# Patient Record
Sex: Female | Born: 1985 | Race: Black or African American | Hispanic: No | Marital: Single | Smoking: Never smoker
Health system: Southern US, Community
[De-identification: ages and names within clinical notes are randomized; demographics above are authoritative.]

## PROBLEM LIST (undated history)

## (undated) DIAGNOSIS — F32A Depression, unspecified: Secondary | ICD-10-CM

## (undated) DIAGNOSIS — F329 Major depressive disorder, single episode, unspecified: Secondary | ICD-10-CM

## (undated) DIAGNOSIS — A599 Trichomoniasis, unspecified: Secondary | ICD-10-CM

## (undated) DIAGNOSIS — E669 Obesity, unspecified: Secondary | ICD-10-CM

## (undated) DIAGNOSIS — R51 Headache: Secondary | ICD-10-CM

## (undated) HISTORY — DX: Headache: R51

## (undated) HISTORY — DX: Trichomoniasis, unspecified: A59.9

## (undated) HISTORY — DX: Depression, unspecified: F32.A

## (undated) HISTORY — DX: Major depressive disorder, single episode, unspecified: F32.9

## (undated) HISTORY — DX: Obesity, unspecified: E66.9

## (undated) HISTORY — PX: TONSILLECTOMY: SUR1361

## (undated) HISTORY — PX: TONSILLECTOMY: SHX5217

---
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
## 2005-04-15 ENCOUNTER — Emergency Department (HOSPITAL_COMMUNITY): Admission: EM | Admit: 2005-04-15 | Discharge: 2005-04-15 | Payer: Self-pay | Admitting: Emergency Medicine

## 2005-05-28 ENCOUNTER — Ambulatory Visit: Payer: Self-pay | Admitting: Nurse Practitioner

## 2005-06-29 ENCOUNTER — Ambulatory Visit: Payer: Self-pay | Admitting: *Deleted

## 2005-10-27 ENCOUNTER — Ambulatory Visit: Payer: Self-pay | Admitting: Nurse Practitioner

## 2005-11-03 ENCOUNTER — Ambulatory Visit: Payer: Self-pay | Admitting: Nurse Practitioner

## 2006-03-06 ENCOUNTER — Ambulatory Visit: Payer: Self-pay | Admitting: Nurse Practitioner
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
## 2006-05-19 ENCOUNTER — Ambulatory Visit: Payer: Self-pay | Admitting: Nurse Practitioner

## 2006-08-19 ENCOUNTER — Ambulatory Visit (HOSPITAL_COMMUNITY): Admission: RE | Admit: 2006-08-19 | Discharge: 2006-08-19 | Payer: Self-pay | Admitting: Obstetrics & Gynecology

## 2006-12-21 ENCOUNTER — Ambulatory Visit: Payer: Self-pay | Admitting: Physician Assistant

## 2006-12-21 ENCOUNTER — Inpatient Hospital Stay (HOSPITAL_COMMUNITY): Admission: AD | Admit: 2006-12-21 | Discharge: 2006-12-21 | Payer: Self-pay | Admitting: Family Medicine

## 2007-01-16 ENCOUNTER — Ambulatory Visit: Payer: Self-pay | Admitting: *Deleted

## 2007-01-16 ENCOUNTER — Inpatient Hospital Stay (HOSPITAL_COMMUNITY): Admission: AD | Admit: 2007-01-16 | Discharge: 2007-01-19 | Payer: Self-pay | Admitting: Obstetrics & Gynecology

## 2007-01-24 ENCOUNTER — Emergency Department (HOSPITAL_COMMUNITY): Admission: EM | Admit: 2007-01-24 | Discharge: 2007-01-24 | Payer: Self-pay | Admitting: Emergency Medicine

## 2007-07-11 IMAGING — US US OB COMP +14 WK
1 series · 13 of 28 positions shown · non-contrast
Comparison: none

CLINICAL DATA: Anatomy scan; EGA by LMP is 17 weeks 5 days.

[Series 1: us ob comp +14 wk · 0.24mm/px · 13 of 75 slices shown]
[im 3/75]
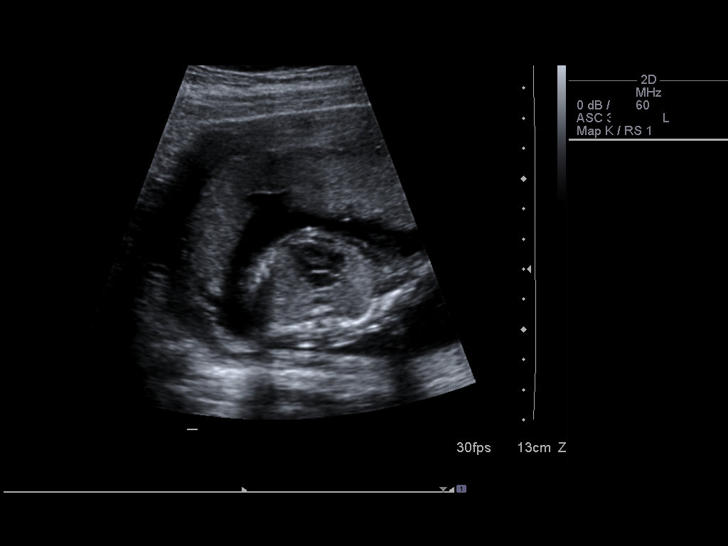
[im 9/75]
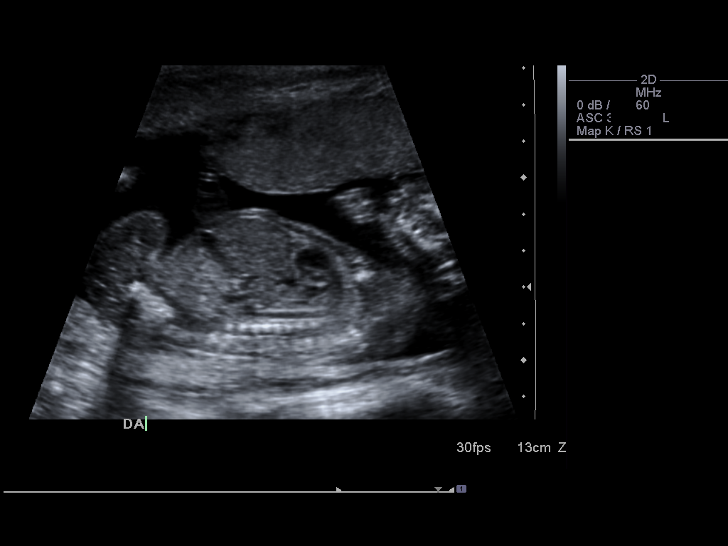
[im 14/75]
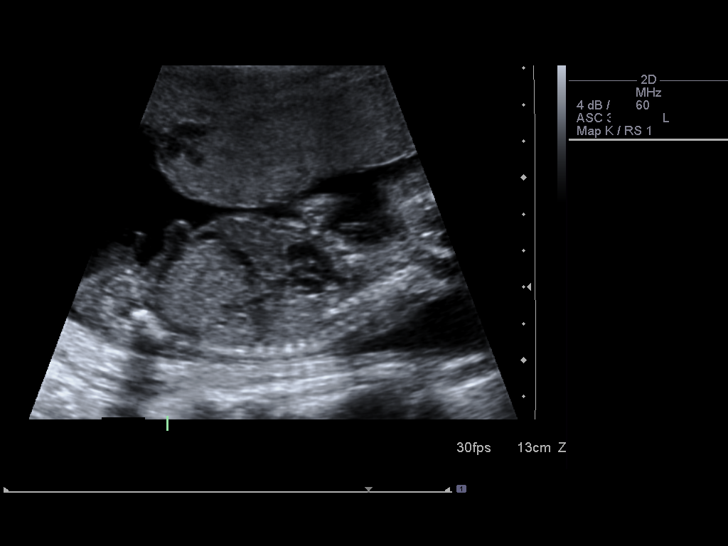
[im 20/75]
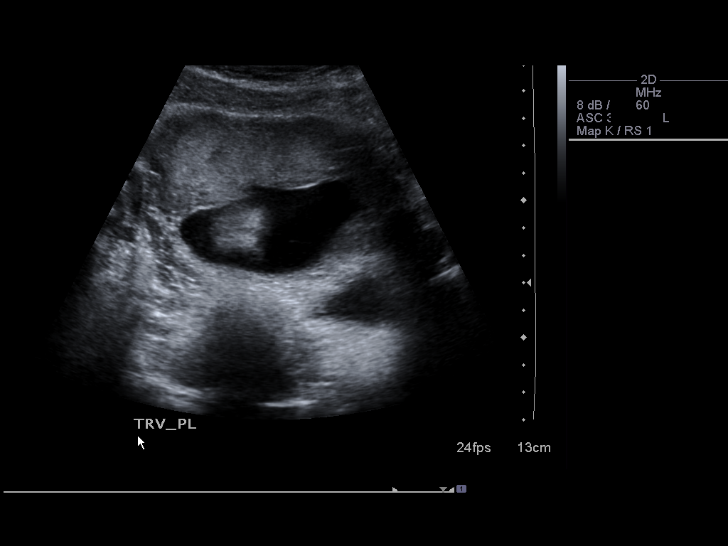
[im 25/75]
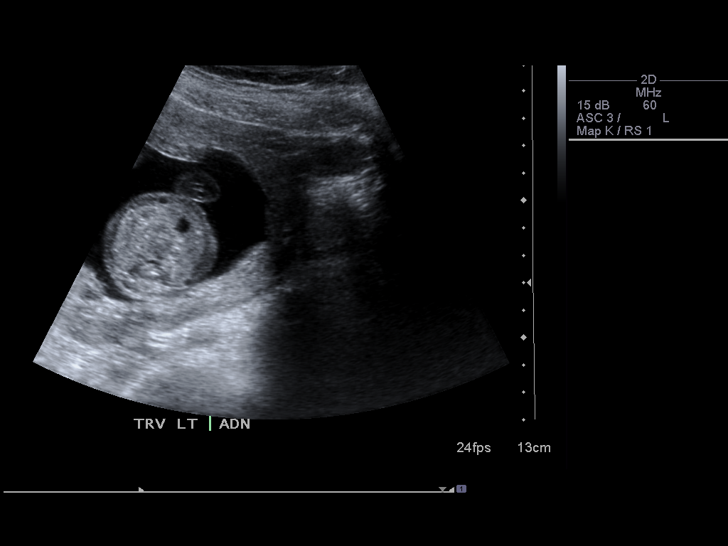
[im 31/75]
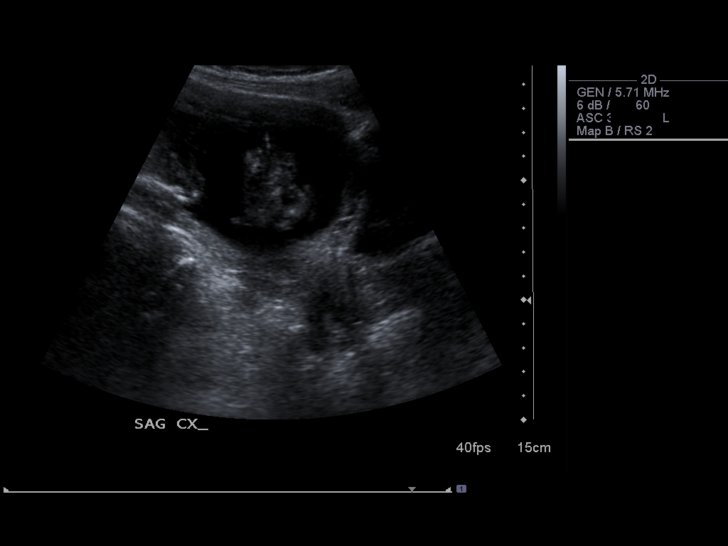
[im 39/75]
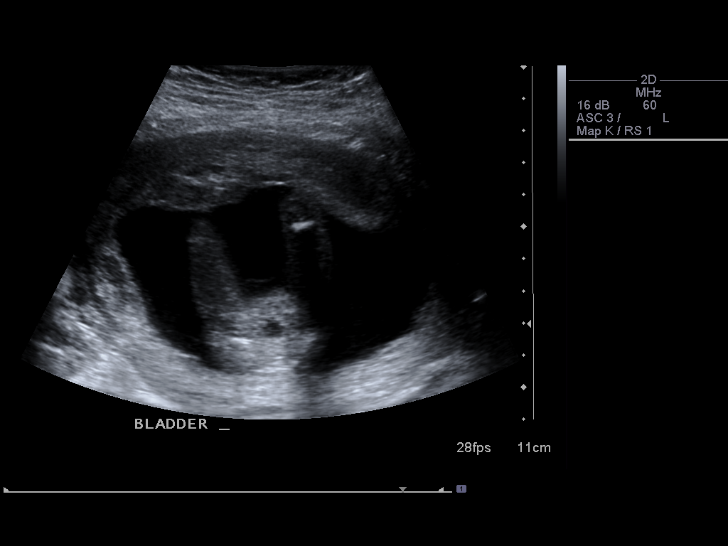
[im 44/75]
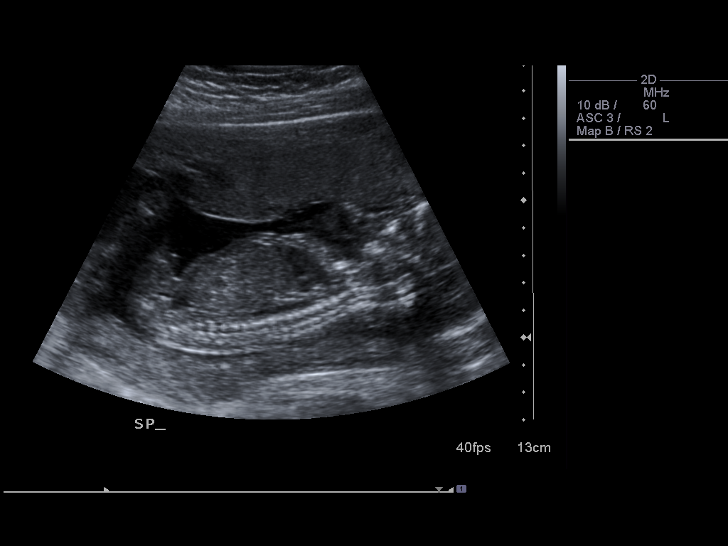
[im 50/75]
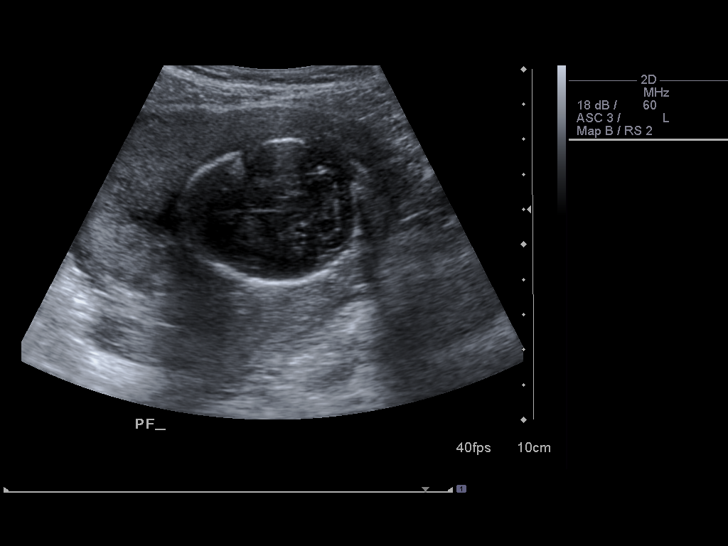
[im 55/75]
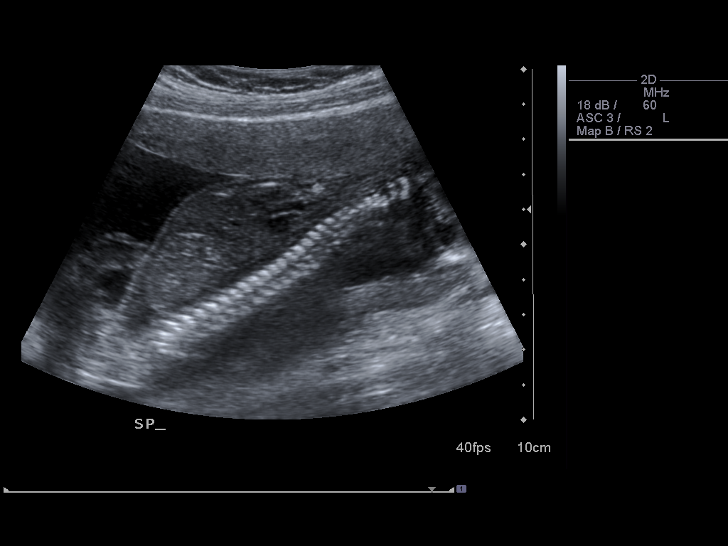
[im 61/75]
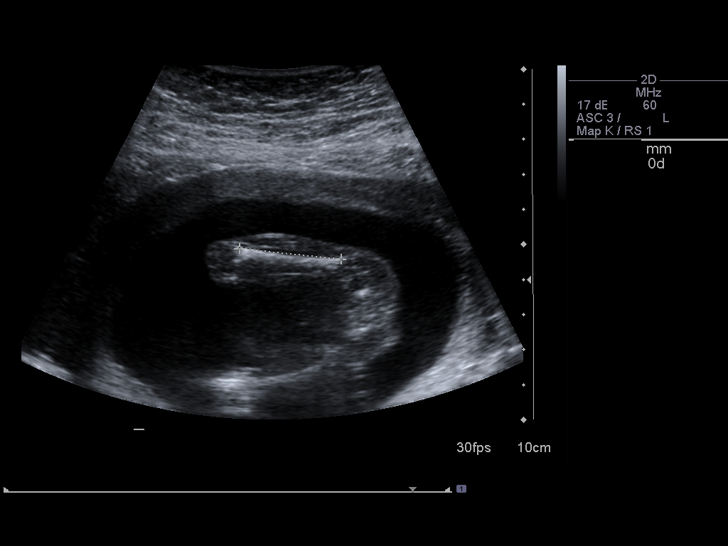
[im 66/75]
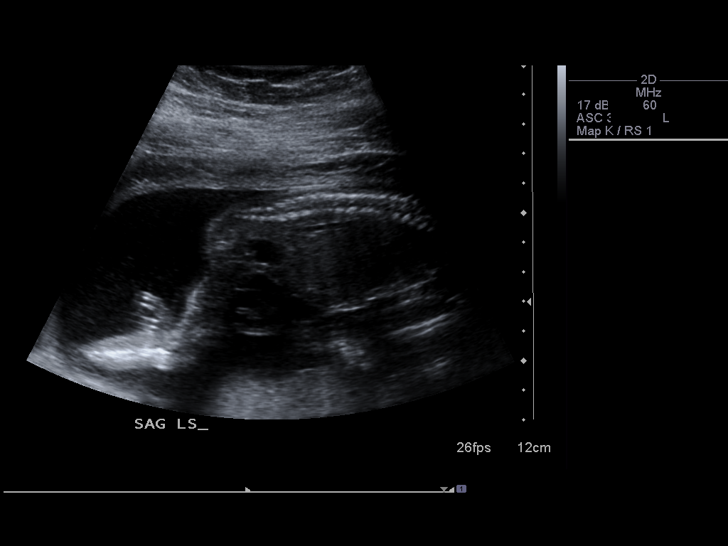
[im 72/75]
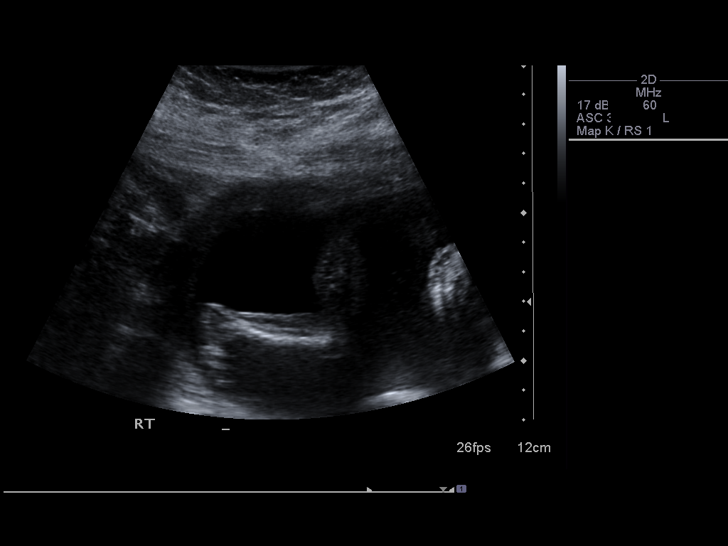

[13 of 28 positions shown; findings below may reference images not displayed]

OBSTETRICAL ULTRASOUND:
 Number of Fetuses:  1
 Heart Rate:  155 bpm
 Movement:  Yes
 Breathing:  No
 Presentation:  Cephalic
 Placental Location:  Right lateral
 Grade:  I
 Previa:  No
 Amniotic Fluid (Subjective):  Normal
 Amniotic Fluid (Objective):  4.6 cm vertical pocket 

 FETAL BIOMETRY
 BPD:  4.2 cm   18 w 5 d 
 HC:  16.1 cm   18 w 6 d 
 AC:  13.7 cm   19 w 1 d 
 FL:  2.9 cm   19 w 0 d 

 MEAN GA:  19 w 0 d   US EDC:  01/13/07

 FETAL ANATOMY
 Lateral Ventricles:  Visualized 
 Thalami/CSP:  Visualized 
 Posterior Fossa:  Visualized 
 Nuchal Region:  NF= 2.4 mm  Visualized 
 Spine:  Visualized 
 4 Chamber Heart on Left:  Visualized 
 Stomach on Left:  Visualized 
 3 Vessel Cord:  Visualized 
 Cord Insertion site:  Visualized 
 Kidneys:  Visualized 
 Bladder:  Visualized 
 Extremities:  Visualized 

 ADDITIONAL ANATOMY VISUALIZED:  LVOT, RVOT, upper lip, orbits, profile, diaphragm, heel, 5th digit, ductal arch, and aortic arch.

 MATERNAL UTERINE AND ADNEXAL FINDINGS
 Cervix:  3.0 cm transabdominal.
IMPRESSION: There is a single living intrauterine gestation with mean gestational age by today?s ultrasound of 19 weeks 0 days, which is over a week older than estimated by LMP.  The visualized anatomy is normal and the fetal indices are concordant.

## 2007-08-28 ENCOUNTER — Emergency Department (HOSPITAL_COMMUNITY): Admission: EM | Admit: 2007-08-28 | Discharge: 2007-08-28 | Payer: Self-pay | Admitting: Emergency Medicine

## 2010-07-05 ENCOUNTER — Inpatient Hospital Stay (HOSPITAL_COMMUNITY)
Admission: AD | Admit: 2010-07-05 | Discharge: 2010-07-06 | Payer: Self-pay | Source: Home / Self Care | Admitting: Family Medicine

## 2010-07-30 ENCOUNTER — Encounter: Payer: Self-pay | Admitting: Internal Medicine

## 2010-07-30 LAB — CONVERTED CEMR LAB: Pap Smear: NORMAL

## 2010-10-11 ENCOUNTER — Other Ambulatory Visit: Payer: Self-pay | Admitting: Internal Medicine

## 2010-10-11 ENCOUNTER — Other Ambulatory Visit: Payer: BC Managed Care – PPO

## 2010-10-11 ENCOUNTER — Encounter: Payer: Self-pay | Admitting: Internal Medicine

## 2010-10-11 ENCOUNTER — Ambulatory Visit (INDEPENDENT_AMBULATORY_CARE_PROVIDER_SITE_OTHER): Payer: BC Managed Care – PPO | Admitting: Internal Medicine

## 2010-10-11 DIAGNOSIS — Z79899 Other long term (current) drug therapy: Secondary | ICD-10-CM

## 2010-10-11 DIAGNOSIS — R51 Headache: Secondary | ICD-10-CM | POA: Insufficient documentation

## 2010-10-11 DIAGNOSIS — IMO0001 Reserved for inherently not codable concepts without codable children: Secondary | ICD-10-CM | POA: Insufficient documentation

## 2010-10-11 DIAGNOSIS — R519 Headache, unspecified: Secondary | ICD-10-CM | POA: Insufficient documentation

## 2010-10-11 DIAGNOSIS — R5383 Other fatigue: Secondary | ICD-10-CM

## 2010-10-11 DIAGNOSIS — F329 Major depressive disorder, single episode, unspecified: Secondary | ICD-10-CM | POA: Insufficient documentation

## 2010-10-11 DIAGNOSIS — R5381 Other malaise: Secondary | ICD-10-CM

## 2010-10-11 LAB — CBC WITH DIFFERENTIAL/PLATELET
Basophils Absolute: 0 10*3/uL (ref 0.0–0.1)
Basophils Relative: 0.2 % (ref 0.0–3.0)
Eosinophils Absolute: 0.1 10*3/uL (ref 0.0–0.7)
Eosinophils Relative: 0.7 % (ref 0.0–5.0)
HCT: 34.8 % — ABNORMAL LOW (ref 36.0–46.0)
Hemoglobin: 12 g/dL (ref 12.0–15.0)
Lymphocytes Relative: 27.2 % (ref 12.0–46.0)
Lymphs Abs: 2.7 10*3/uL (ref 0.7–4.0)
MCHC: 34.4 g/dL (ref 30.0–36.0)
MCV: 89.1 fl (ref 78.0–100.0)
Monocytes Absolute: 0.7 10*3/uL (ref 0.1–1.0)
Monocytes Relative: 6.6 % (ref 3.0–12.0)
Neutro Abs: 6.5 10*3/uL (ref 1.4–7.7)
Neutrophils Relative %: 65.3 % (ref 43.0–77.0)
Platelets: 386 10*3/uL (ref 150.0–400.0)
RBC: 3.91 Mil/uL (ref 3.87–5.11)
RDW: 13 % (ref 11.5–14.6)
WBC: 10 10*3/uL (ref 4.5–10.5)

## 2010-10-11 LAB — BASIC METABOLIC PANEL
BUN: 14 mg/dL (ref 6–23)
CO2: 26 mEq/L (ref 19–32)
Calcium: 9.3 mg/dL (ref 8.4–10.5)
Chloride: 107 mEq/L (ref 96–112)
Creatinine, Ser: 0.9 mg/dL (ref 0.4–1.2)
GFR: 93.43 mL/min (ref >60.00)
Glucose, Bld: 86 mg/dL (ref 70–99)
Potassium: 3.6 mEq/L (ref 3.5–5.1)
Sodium: 139 mEq/L (ref 135–145)

## 2010-10-11 LAB — CK: Total CK: 83 U/L (ref 7–177)

## 2010-10-11 LAB — HEPATIC FUNCTION PANEL
Albumin: 4.2 g/dL (ref 3.5–5.2)
Alkaline Phosphatase: 49 U/L (ref 39–117)
Bilirubin, Direct: 0.1 mg/dL (ref 0.0–0.3)

## 2010-10-11 LAB — CONVERTED CEMR LAB: Vit D, 25-Hydroxy: 23 ng/mL — ABNORMAL LOW (ref 30–89)

## 2010-10-11 LAB — HIGH SENSITIVITY CRP: CRP, High Sensitivity: 2.46 mg/L (ref 0.00–5.00)

## 2010-10-15 NOTE — Assessment & Plan Note (Signed)
Summary: NEW PT / Angela Perkins  #   Vital Signs:  Patient profile:   25 year old female Menstrual status:  regular LMP:     09/22/2010 Height:      63 inches (160.02 cm) Weight:      205.50 pounds (93.41 kg) BMI:     36.53 O2 Sat:      99 % on Room air Temp:     99.0 degrees F (37.22 degrees C) oral Pulse rate:   75 / minute Pulse rhythm:   regular Resp:     14 per minute BP sitting:   120 / 70  (left arm) Cuff size:   regular  Vitals Entered By: Burnard Leigh CMA(AAMA) (October 11, 2010 4:23 PM)  Nutrition Counseling: Patient's BMI is greater than 25 and therefore counseled on weight management options.  O2 Flow:  Room air CC: NP to establish.Pt c/o all-over idiopathic pain in body.has been waiting on PA for Lyrica that just went through.Pt c/o extreme fatigue & weakness.Pt c/o still having migraines w/Rx/sls, cma, Preventive Care Is Patient Diabetic? No Pain Assessment Patient in pain? yes      Comments Pt has not started Lyrica; has been waiting on PA LMP (date): 09/22/2010     Menstrual Status regular Enter LMP: 09/22/2010 Last PAP Result Normal   Primary Care Provider:  Yetta Barre  CC:  NP to establish.Pt c/o all-over idiopathic pain in body.has been waiting on PA for Lyrica that just went through.Pt c/o extreme fatigue & weakness.Pt c/o still having migraines w/Rx/sls, cma, and Preventive Care.  History of Present Illness: New to me she tells me that she has bipolar depression and was sent to PCP by Dr. Evelene Croon for evaluation of "sharp, shooting pain throughout my entire body for one year with some worsening over the last 2 months." Lyrica has been prescribed but she has not started taking it yet.  Preventive Screening-Counseling & Management  Alcohol-Tobacco     Alcohol drinks/day: 0     Alcohol Counseling: not indicated; patient does not drink     Smoking Status: never     Tobacco Counseling: not indicated; no tobacco use  Caffeine-Diet-Exercise     Does Patient  Exercise: no  Hep-HIV-STD-Contraception     Hepatitis Risk: no risk noted     HIV Risk: no risk noted     STD Risk: no risk noted      Sexual History:  currently monogamous.        Drug Use:  no.        Blood Transfusions:  no.    Medications Prior to Update: 1)  None  Current Medications (verified): 1)  Lyrica .... Take As Directed 2)  Lamictal 150 Mg Tabs (Lamotrigine) .... Take 1  Every Morning & 1 At Bedtime 3)  Tegretol 100 Mg Chew (Carbamazepine) .... Take 1  Every Morning and 2 At Bedtime 4)  Kids Gummy Bear Vitamins  Chew (Pediatric Multivit-Minerals-C) .... Take 2 Once Daily  Allergies (verified): No Known Drug Allergies  Past History:  Past Medical History: Depression Headache  Past Surgical History: Tonsillectomy  Family History: Family History of Arthritis Family History Depression Family History Ovarian cancer Sister has fibromyalgia  Social History: Occupation: Hydrologist Single Never Smoked Alcohol use-no Drug use-no Regular exercise-no Smoking Status:  never Hepatitis Risk:  no risk noted HIV Risk:  no risk noted STD Risk:  no risk noted Sexual History:  currently monogamous Blood Transfusions:  no Drug Use:  no  Does Patient Exercise:  no  Review of Systems       The patient complains of weight gain.  The patient denies anorexia, fever, weight loss, decreased hearing, hoarseness, chest pain, syncope, dyspnea on exertion, peripheral edema, prolonged cough, hemoptysis, abdominal pain, hematuria, muscle weakness, suspicious skin lesions, difficulty walking, unusual weight change, abnormal bleeding, enlarged lymph nodes, and angioedema.   General:  Complains of fatigue and malaise; denies chills, fever, loss of appetite, sleep disorder, sweats, weakness, and weight loss. Psych:  Complains of depression; denies alternate hallucination ( auditory/visual), anxiety, easily angered, easily tearful, irritability, mental problems, panic attacks, sense  of great danger, suicidal thoughts/plans, thoughts of violence, unusual visions or sounds, and thoughts /plans of harming others.  Physical Exam  General:  alert, well-developed, well-nourished, well-hydrated, appropriate dress, normal appearance, healthy-appearing, cooperative to examination, good hygiene, and overweight-appearing.   Head:  normocephalic, atraumatic, no abnormalities observed, and no abnormalities palpated.   Eyes:  vision grossly intact, pupils equal, pupils round, and pupils reactive to light.   Ears:  R ear normal and L ear normal.   Mouth:  Oral mucosa and oropharynx without lesions or exudates.  Teeth in good repair. Neck:  supple, full ROM, no masses, no thyromegaly, no thyroid nodules or tenderness, no JVD, no HJR, normal carotid upstroke, no carotid bruits, and no cervical lymphadenopathy.   Lungs:  Normal respiratory effort, chest expands symmetrically. Lungs are clear to auscultation, no crackles or wheezes. Heart:  Normal rate and regular rhythm. S1 and S2 normal without gallop, murmur, click, rub or other extra sounds. Abdomen:  soft, non-tender, normal bowel sounds, no distention, no masses, no guarding, no rigidity, no rebound tenderness, no abdominal hernia, no inguinal hernia, no hepatomegaly, and no splenomegaly.   Msk:  normal ROM, no joint tenderness, no joint swelling, no joint warmth, no redness over joints, no joint deformities, no joint instability, no crepitation, and no muscle atrophy.   Pulses:  R and L carotid,radial,femoral,dorsalis pedis and posterior tibial pulses are full and equal bilaterally Extremities:  No clubbing, cyanosis, edema, or deformity noted with normal full range of motion of all joints.   Neurologic:  No cranial nerve deficits noted. Station and gait are normal. Plantar reflexes are down-going bilaterally. DTRs are symmetrical throughout. Sensory, motor and coordinative functions appear intact. Skin:  Intact without suspicious lesions  or rashes Cervical Nodes:  No lymphadenopathy noted Axillary Nodes:  No palpable lymphadenopathy Inguinal Nodes:  No significant adenopathy Psych:  Cognition and judgment appear intact. Alert and cooperative with normal attention span and concentration. No apparent delusions, illusions, hallucinations   Impression & Recommendations:  Problem # 1:  FIBROMYALGIA (ICD-729.1) Assessment New she will start Lyrica and if that does not help will try Savella Orders: Venipuncture (21308) TLB-BMP (Basic Metabolic Panel-BMET) (80048-METABOL) TLB-CBC Platelet - w/Differential (85025-CBCD) TLB-Hepatic/Liver Function Pnl (80076-HEPATIC) TLB-TSH (Thyroid Stimulating Hormone) (84443-TSH) TLB-CRP-High Sensitivity (C-Reactive Protein) (86140-FCRP) T-Vitamin D (25-Hydroxy) (65784-69629) TLB-CK Total Only(Creatine Kinase/CPK) (82550-CK) T-Rheumatoid Factor (52841-32440) T-Antinuclear Antib (ANA) [phone])  Problem # 2:  LONG-TERM (CURRENT) USE OF OTHER MEDICATIONS (ICD-V58.69) Assessment: New  Orders: Venipuncture (40347) TLB-BMP (Basic Metabolic Panel-BMET) (80048-METABOL) TLB-CBC Platelet - w/Differential (85025-CBCD) TLB-Hepatic/Liver Function Pnl (80076-HEPATIC) TLB-TSH (Thyroid Stimulating Hormone) (84443-TSH) TLB-CRP-High Sensitivity (C-Reactive Protein) (86140-FCRP) T-Vitamin D (25-Hydroxy) (42595-63875) TLB-CK Total Only(Creatine Kinase/CPK) (82550-CK) T-Rheumatoid Factor (64332-95188) T-Antinuclear Antib (ANA) (41660-63016)  Problem # 3:  FATIGUE, ACUTE (ICD-780.79) Assessment: New will screen for secondary causes Orders: Venipuncture (01093) TLB-BMP (Basic Metabolic Panel-BMET) (80048-METABOL) TLB-CBC Platelet - w/Differential (  85025-CBCD) TLB-Hepatic/Liver Function Pnl (80076-HEPATIC) TLB-TSH (Thyroid Stimulating Hormone) (84443-TSH) TLB-CRP-High Sensitivity (C-Reactive Protein) (86140-FCRP) T-Vitamin D (25-Hydroxy) (04540-98119) TLB-CK Total Only(Creatine Kinase/CPK)  (82550-CK) T-Rheumatoid Factor (14782-95621) T-Antinuclear Antib (ANA) (30865-78469)  Problem # 4:  MYALGIA (ICD-729.1) Assessment: New as above  Complete Medication List: 1)  Lyrica  .... Take as directed 2)  Lamictal 150 Mg Tabs (Lamotrigine) .... Take 1  every morning & 1 at bedtime 3)  Tegretol 100 Mg Chew (Carbamazepine) .... Take 1  every morning and 2 at bedtime 4)  Kids Gummy Bear Vitamins Chew (Pediatric multivit-minerals-c) .... Take 2 once daily  PAP Screening:    Hx Cervical Dysplasia in last 5 yrs? No    3 normal PAP smears in last 5 yrs? Yes    Last PAP smear:  07/30/2010  PAP Smear Results:    Date of Exam:  07/30/2010    Results:  Normal  Osteoporosis Risk Assessment:  Risk Factors for Fracture or Low Bone Density:   Smoking status:       never  Patient Instructions: 1)  Please schedule a follow-up appointment in 2 weeks. 2)  It is important that you exercise regularly at least 20 minutes 5 times a week. If you develop chest pain, have severe difficulty breathing, or feel very tired , stop exercising immediately and seek medical attention. 3)  You need to lose weight. Consider a lower calorie diet and regular exercise.    Orders Added: 1)  Venipuncture [36415] 2)  TLB-BMP (Basic Metabolic Panel-BMET) [80048-METABOL] 3)  TLB-CBC Platelet - w/Differential [85025-CBCD] 4)  TLB-Hepatic/Liver Function Pnl [80076-HEPATIC] 5)  TLB-TSH (Thyroid Stimulating Hormone) [84443-TSH] 6)  TLB-CRP-High Sensitivity (C-Reactive Protein) [86140-FCRP] 7)  T-Vitamin D (25-Hydroxy) [62952-84132] 8)  TLB-CK Total Only(Creatine Kinase/CPK) [82550-CK] 9)  T-Rheumatoid Factor [44010-27253] 10)  T-Antinuclear Antib (ANA) [66440-34742] 11)  New Patient Level IV [59563]

## 2010-10-29 LAB — URINALYSIS, ROUTINE W REFLEX MICROSCOPIC
Bilirubin Urine: NEGATIVE
Glucose, UA: NEGATIVE mg/dL
Ketones, ur: NEGATIVE mg/dL
Nitrite: NEGATIVE
pH: 6.5 (ref 5.0–8.0)

## 2010-10-29 LAB — URINE MICROSCOPIC-ADD ON

## 2010-10-29 LAB — WET PREP, GENITAL

## 2010-10-29 LAB — GC/CHLAMYDIA PROBE AMP, GENITAL: Chlamydia, DNA Probe: NEGATIVE

## 2011-04-18 ENCOUNTER — Telehealth: Payer: Self-pay | Admitting: *Deleted

## 2011-04-18 NOTE — Telephone Encounter (Signed)
Pt advised of slightly low vit d and mildly anemic. Not requiring treatment via VM

## 2011-04-18 NOTE — Telephone Encounter (Signed)
Patient requesting results of labs from February.

## 2011-04-18 NOTE — Telephone Encounter (Signed)
Mild anemia and slightly low vitamin d level otherwise normal

## 2011-05-02 ENCOUNTER — Ambulatory Visit: Payer: BC Managed Care – PPO | Admitting: Internal Medicine

## 2011-05-02 DIAGNOSIS — Z0289 Encounter for other administrative examinations: Secondary | ICD-10-CM

## 2012-08-26 ENCOUNTER — Encounter: Payer: Self-pay | Admitting: Obstetrics & Gynecology

## 2012-08-26 ENCOUNTER — Ambulatory Visit (INDEPENDENT_AMBULATORY_CARE_PROVIDER_SITE_OTHER): Payer: BC Managed Care – PPO | Admitting: Obstetrics & Gynecology

## 2012-08-26 VITALS — BP 120/72 | HR 67 | Temp 98.5°F | Resp 16 | Ht 63.0 in | Wt 178.0 lb

## 2012-08-26 DIAGNOSIS — Z124 Encounter for screening for malignant neoplasm of cervix: Secondary | ICD-10-CM

## 2012-08-26 DIAGNOSIS — Z113 Encounter for screening for infections with a predominantly sexual mode of transmission: Secondary | ICD-10-CM

## 2012-08-26 DIAGNOSIS — Z30432 Encounter for removal of intrauterine contraceptive device: Secondary | ICD-10-CM

## 2012-08-26 DIAGNOSIS — Z01419 Encounter for gynecological examination (general) (routine) without abnormal findings: Secondary | ICD-10-CM

## 2012-08-26 DIAGNOSIS — Z0000 Encounter for general adult medical examination without abnormal findings: Secondary | ICD-10-CM

## 2012-08-26 DIAGNOSIS — Z1151 Encounter for screening for human papillomavirus (HPV): Secondary | ICD-10-CM

## 2012-08-26 NOTE — Progress Notes (Signed)
Patient ID: Angela Perkins, female   DOB: [birth year], 27 y.o.   MRN: [id number] Subjective:    Angela Perkins is a 27 y.o. female who presents for an annual exam. The patient has no complaints today. She would like her Mirena removed. It was placed in 2008. She has been abstinent since 2011 and doesn't plan to resume sex in the foreseeable future. She still has heavy periods with the Mirena. The patient is not currently sexually active. GYN screening history: last pap: was normal. The patient wears seatbelts: yes. The patient participates in regular exercise: not asked. Has the patient ever been transfused or tattooed?: no. The patient reports that there is not domestic violence in her life.   Menstrual History: OB History    Grav Para Term Preterm Abortions TAB SAB Ect Mult Living   1 1 1       1       Menarche age: 80 Patient's last menstrual period was 08/24/2012.    The following portions of the patient's history were reviewed and updated as appropriate: allergies, current medications, past family history, past medical history, past social history, past surgical history and problem list.  Review of Systems A comprehensive review of systems was negative. She lives with her 58 yo son and works at [employer] as a Holiday representative. She declines a flu vaccine and will consider Gardasil (written information given).   Objective:    BP 120/72  Pulse 67  Temp 98.5 F (36.9 C) (Oral)  Resp 16  Ht 5\' 3"  (1.6 m)  Wt 178 lb (80.74 kg)  BMI 31.53 kg/m2  LMP 08/24/2012  General Appearance:    Alert, cooperative, no distress, appears stated age  Head:    Normocephalic, without obvious abnormality, atraumatic  Eyes:    PERRL, conjunctiva/corneas clear, EOM's intact, fundi    benign, both eyes  Ears:    Normal TM's and external ear canals, both ears  Nose:   Nares normal, septum midline, mucosa normal, no drainage    or sinus tenderness  Throat:   Lips, mucosa, and tongue normal;  teeth and gums normal  Neck:   Supple, symmetrical, trachea midline, no adenopathy;    thyroid:  no enlargement/tenderness/nodules; no carotid   bruit or JVD  Back:     Symmetric, no curvature, ROM normal, no CVA tenderness  Lungs:     Clear to auscultation bilaterally, respirations unlabored  Chest Wall:    No tenderness or deformity   Heart:    Regular rate and rhythm, S1 and S2 normal, no murmur, rub   or gallop  Breast Exam:    No tenderness, masses, or nipple abnormality  Abdomen:     Soft, non-tender, bowel sounds active all four quadrants,    no masses, no organomegaly  Genitalia:    Normal female without lesion, discharge or tenderness, moderate amount of menstrual bleeding. Mirena easily removed (it was only in as far as the cervix), NSSA, NT, normal adnexa exam     Extremities:   Extremities normal, atraumatic, no cyanosis or edema  Pulses:   2+ and symmetric all extremities  Skin:   Skin color, texture, turgor normal, no rashes or lesions  Lymph nodes:   Cervical, supraclavicular, and axillary nodes normal  Neurologic:   CNII-XII intact, normal strength, sensation and reflexes    throughout  .    Assessment:    Healthy female exam.    Plan:     Chlamydia specimen. GC specimen.  Pap smear.  HPV cotesting SBE/Gardasil recommended

## 2014-06-19 ENCOUNTER — Encounter: Payer: Self-pay | Admitting: Obstetrics & Gynecology

## 2016-01-26 ENCOUNTER — Encounter (HOSPITAL_COMMUNITY): Payer: Self-pay | Admitting: Emergency Medicine

## 2016-01-26 ENCOUNTER — Emergency Department (HOSPITAL_COMMUNITY)
Admission: EM | Admit: 2016-01-26 | Discharge: 2016-01-26 | Disposition: A | Discharge status: Home / Self Care | Payer: Medicaid Other | Attending: Emergency Medicine | Admitting: Emergency Medicine

## 2016-01-26 DIAGNOSIS — F329 Major depressive disorder, single episode, unspecified: Secondary | ICD-10-CM | POA: Insufficient documentation

## 2016-01-26 DIAGNOSIS — K0889 Other specified disorders of teeth and supporting structures: Secondary | ICD-10-CM | POA: Diagnosis present

## 2016-01-26 DIAGNOSIS — K029 Dental caries, unspecified: Secondary | ICD-10-CM | POA: Diagnosis not present

## 2016-01-26 DIAGNOSIS — E669 Obesity, unspecified: Secondary | ICD-10-CM | POA: Insufficient documentation

## 2016-01-26 MED ORDER — AMOXICILLIN 500 MG PO CAPS: 500.0000 mg | ORAL_CAPSULE | Freq: Three times a day (TID) | ORAL | Status: DC

## 2016-01-26 MED ORDER — BUPIVACAINE-EPINEPHRINE (PF) 0.5% -1:200000 IJ SOLN
1.8000 mL | Freq: Once | INTRAMUSCULAR | Status: AC
  Administered 2016-01-26: 1.8 mL
  Filled 2016-01-26: qty 1.8

## 2016-01-26 MED ORDER — HYDROCODONE-ACETAMINOPHEN 5-325 MG PO TABS: ORAL_TABLET | ORAL | Status: DC

## 2016-01-26 MED ORDER — AMOXICILLIN 500 MG PO CAPS
500.0000 mg | ORAL_CAPSULE | Freq: Once | ORAL | Status: AC
  Administered 2016-01-26: 500 mg via ORAL
  Filled 2016-01-26: qty 1

## 2016-01-26 NOTE — Discharge Instructions (Signed)
Take percocet for breakthrough pain, do not drink alcohol, drive, care for children or do other critical tasks while taking percocet.  Return to the emergency room for fever, change in vision, redness to the face that rapidly spreads towards the eye, nausea or vomiting, difficulty swallowing or shortness of breath.   Apply warm compresses to jaw throughout the day.   Take your antibiotics as directed and to the end of the course. DO NOT drink alcohol when taking metronidazole, it will make you very sick!   Followup with a dentist is very important for ongoing evaluation and management of recurrent dental pain. Return to emergency department for emergent changing or worsening symptoms."  Low-cost dental clinic: [facility]**David  Civils  at [phone]**    Dental Care and Dentist Visits Dental care supports good overall health. Regular dental visits can also help you avoid dental pain, bleeding, infection, and other more serious health problems in the future. It is important to keep the mouth healthy because diseases in the teeth, gums, and other oral tissues can spread to other areas of the body. Some problems, such as diabetes, heart disease, and pre-term labor have been associated with poor oral health.  See your dentist every 6 months. If you experience emergency problems such as a toothache or broken tooth, go to the dentist right away. If you see your dentist regularly, you may catch problems early. It is easier to be treated for problems in the early stages.  WHAT TO EXPECT AT A DENTIST VISIT  Your dentist will look for many common oral health problems and recommend proper treatment. At your regular dental visit, you can expect:  Gentle cleaning of the teeth and gums. This includes scraping and polishing. This helps to remove the sticky substance around the teeth and gums (plaque). Plaque forms in the mouth shortly after eating. Over time, plaque hardens on the teeth as tartar. If tartar is not  removed regularly, it can cause problems. Cleaning also helps remove stains.  Periodic X-rays. These pictures of the teeth and supporting bone will help your dentist assess the health of your teeth.  Periodic fluoride treatments. Fluoride is a natural mineral shown to help strengthen teeth. Fluoride treatmentinvolves applying a fluoride gel or varnish to the teeth. It is most commonly done in children.  Examination of the mouth, tongue, jaws, teeth, and gums to look for any oral health problems, such as:  Cavities (dental caries). This is decay on the tooth caused by plaque, sugar, and acid in the mouth. It is best to catch a cavity when it is small.  Inflammation of the gums caused by plaque buildup (gingivitis).  Problems with the mouth or malformed or misaligned teeth.  Oral cancer or other diseases of the soft tissues or jaws. KEEP YOUR TEETH AND GUMS HEALTHY For healthy teeth and gums, follow these general guidelines as well as your dentist's specific advice:  Have your teeth professionally cleaned at the dentist every 6 months.  Brush twice daily with a fluoride toothpaste.  Floss your teeth daily.  Ask your dentist if you need fluoride supplements, treatments, or fluoride toothpaste.  Eat a healthy diet. Reduce foods and drinks with added sugar.  Avoid smoking. TREATMENT FOR ORAL HEALTH PROBLEMS If you have oral health problems, treatment varies depending on the conditions present in your teeth and gums.  Your caregiver will most likely recommend good oral hygiene at each visit.  For cavities, gingivitis, or other oral health disease, your caregiver will perform  a procedure to treat the problem. This is typically done at a separate appointment. Sometimes your caregiver will refer you to another dental specialist for specific tooth problems or for surgery. SEEK IMMEDIATE DENTAL CARE IF:  You have pain, bleeding, or soreness in the gum, tooth, jaw, or mouth area.  A  permanent tooth becomes loose or separated from the gum socket.  You experience a blow or injury to the mouth or jaw area.   This information is not intended to replace advice given to you by your health care provider. Make sure you discuss any questions you have with your health care provider.   Document Released: 04/16/2011 Document Revised: 10/27/2011 Document Reviewed: 04/16/2011 Elsevier Interactive Patient Education [employer]   You may also call [phone]  Dental Assistance  If the dentist on-call cannot see you, please use the resources below:   Patients with Medicaid: [facility] [phone] [address], [phone] [phone]  If unable to pay, or uninsured, contact [facility] [phone]) or [facility] [phone] in [place], [phone] in [facility]) to become qualified for the adult dental clinic  Other Low-Cost Community Dental Services: [facility]- [address] Natasha Bence [place], [place], [address]    [phone], Ext. [phone]    2nd and 4th Thursday of the month at 6:30am    10 clients each day by appointment, can sometimes see walk-in patients if someone does not show for an appointment [facility]- [address] Ether Griffins [place], [place], [address]    [phone] [facility] [address] [place], [place], [address]    [phone]  [facility]- [phone] [facility] Health Department- [phone] [facility] Department- [phone]

## 2016-01-26 NOTE — ED Notes (Signed)
Pt reports left upper toothache x 1 week. Has appointment with dentist coming Wednesday. Been taking ibuprofen , sts helped only for short time.

## 2016-01-26 NOTE — ED Provider Notes (Signed)
CSN: [id number]     Arrival date & time 01/26/16  1152 History  By signing my name below, I, Placido SouLogan Joldersma, attest that this documentation has been prepared under the direction and in the presence of [employer] Ginnifer Creelman, PA-C. Electronically Signed: Placido SouLogan Joldersma, ED Scribe. 01/26/2016. 12:45 PM.   Chief Complaint  Patient presents with  . Dental Pain   The history is provided by the patient. No language interpreter was used.    HPI Comments: Angela Perkins is a 30 y.o. female who presents to the Emergency Department complaining of constant, moderate, left upper dental pain x 1 week. She reports associated, mild, left sided neck pain which she states is a chronic issue and has worsened since the onset of her dental pain. She additionally reports associated, mild, swelling surrounding the affected region. Her pain worsens when chewing. Pt states she has an appointment with her dental provider in 4 days. She has taken OTC ibuprofen, tylenol and hydrocodone from a friend's rx which provides short term pain relief. She denies any known drug allergies. Pt denies fevers or chills.   Past Medical History  Diagnosis Date  . Depression   . Headache(784.0)   . Trichomonas   . Obesity    Past Surgical History  Procedure Laterality Date  . Tonsillectomy     Family History  Problem Relation Age of Onset  . Fibromyalgia Sister   . Arthritis Other   . Cancer Mother     Ovarian  . Depression Other   . Cancer - Ovarian Sister   . Cancer - Cervical Maternal Aunt    Social History  Substance Use Topics  . Smoking status: Never Smoker   . Smokeless tobacco: Never Used     Comment: Regular Exercise - No  . Alcohol Use: No   OB History    Gravida Para Term Preterm AB TAB SAB Ectopic Multiple Living   1 1 1       1      Review of Systems A complete 10 system review of systems was obtained and all systems are negative except as noted in the HPI and PMH.   Allergies  Review of patient's  allergies indicates no known allergies.  Home Medications   Prior to Admission medications   Medication Sig Start Date End Date Taking? Authorizing Provider  lamoTRIgine (LAMICTAL) 150 MG tablet Take 150 mg by mouth every 12 (twelve) hours.      Historical Provider, MD  Pediatric Multivit-Minerals-C (KIDS GUMMY BEAR VITAMINS) CHEW Chew 2 each by mouth daily.      Historical Provider, MD   BP 141/89 mmHg  Pulse 69  Temp(Src) 98.3 F (36.8 C) (Oral)  Resp 18  SpO2 100%  LMP 01/15/2016    Physical Exam  Constitutional: She is oriented to person, place, and time. She appears well-developed and well-nourished. No distress.  HENT:  Head: Normocephalic and atraumatic.  Mouth/Throat: Uvula is midline and oropharynx is clear and moist. No trismus in the jaw. No uvula swelling. No oropharyngeal exudate, posterior oropharyngeal edema, posterior oropharyngeal erythema or tonsillar abscesses.  Generally poor dentition, no gingival swelling, erythema or tenderness to palpation. Patient is handling their secretions. There is no tenderness to palpation or firmness underneath tongue bilaterally. No trismus.    Eyes: Conjunctivae and EOM are normal. Pupils are equal, round, and reactive to light.  Neck: Normal range of motion.  Cardiovascular: Normal rate, regular rhythm and intact distal pulses.   Pulmonary/Chest: Effort normal and breath  sounds normal. No stridor. No respiratory distress. She has no wheezes. She has no rales. She exhibits no tenderness.  Abdominal: Soft. Bowel sounds are normal. She exhibits no distension and no mass. There is no tenderness. There is no rebound and no guarding.  Musculoskeletal: Normal range of motion.  Lymphadenopathy:    She has no cervical adenopathy.  Neurological: She is alert and oriented to person, place, and time.  Skin: Skin is warm and dry.  Psychiatric: She has a normal mood and affect.  Nursing note and vitals reviewed.   ED Course  .Nerve  Block Date/Time: 01/26/2016 4:01 PM Performed by: Wynetta Emery Authorized by: Wynetta Emery Consent: Verbal consent obtained. Risks and benefits: risks, benefits and alternatives were discussed Consent given by: patient Patient identity confirmed: verbally with patient Indications: pain relief Body area: face/mouth Nerve: posterior superior alveolar Laterality: left Patient sedated: no Preparation: Patient was prepped and draped in the usual sterile fashion. Patient position: sitting Needle gauge: 27 G Location technique: anatomical landmarks Local anesthetic: bupivacaine 0.5% with epinephrine Anesthetic total: 1.8 ml Outcome: pain improved Patient tolerance: Patient tolerated the procedure well with no immediate complications    DIAGNOSTIC STUDIES: Oxygen Saturation is 100% on RA, normal by my interpretation.    COORDINATION OF CARE: 12:45 PM Discussed next steps with pt including a dental block and d/c with rx Vicodin and amoxicillin. Pt verbalized understanding and is agreeable with the plan.   Labs Review Labs Reviewed - No data to display  Imaging Review No results found.   EKG Interpretation None      MDM   Final diagnoses:  None    Filed Vitals:   01/26/16 1202 01/26/16 1327  BP: 141/89 122/73  Pulse: 69 68  Temp: 98.3 F (36.8 C) 98.5 F (36.9 C)  TempSrc: Oral Oral  Resp: 18 16  SpO2: 100% 100%    Medications  bupivacaine-epinephrine (MARCAINE W/ EPI) 0.5% -1:200000 injection 1.8 mL (1.8 mLs Infiltration Given 01/26/16 1255)  amoxicillin (AMOXIL) capsule 500 mg (500 mg Oral Given 01/26/16 1255)    Angela Perkins is 30 y.o. female presenting with dental pain associated with dental caries but no signs or symptoms of dental abscess. Patient afebrile, non toxic appearing and swallowing secretions well. I gave patient referral to dentist and stressed the importance of dental follow up for definitive management of dental issues. Patient  voices understanding and is agreeable to plan.  Evaluation does not show pathology that would require ongoing emergent intervention or inpatient treatment. Pt is hemodynamically stable and mentating appropriately. Discussed findings and plan with patient/guardian, who agrees with care plan. All questions answered. Return precautions discussed and outpatient follow up given.   Discharge Medication List as of 01/26/2016  1:23 PM    START taking these medications   Details  amoxicillin (AMOXIL) 500 MG capsule Take 1 capsule (500 mg total) by mouth 3 (three) times daily., Starting 01/26/2016, Until Discontinued, Print    HYDROcodone-acetaminophen (NORCO/VICODIN) 5-325 MG tablet Take 1-2 tablets by mouth every 6 hours as needed for pain and/or cough., Print        I personally performed the services described in this documentation, which was scribed in my presence. The recorded information has been reviewed and is accurate.   Wynetta Emery, PA-C 01/26/16 1602  Samuel Jester, DO 01/27/16 1025

## 2018-09-02 ENCOUNTER — Encounter: Payer: Self-pay | Admitting: Nurse Practitioner

## 2018-09-02 ENCOUNTER — Ambulatory Visit: Payer: Self-pay | Admitting: Nurse Practitioner

## 2018-09-02 VITALS — BP 112/78 | HR 105 | Temp 100.7°F | Resp 12 | Wt 196.8 lb

## 2018-09-02 DIAGNOSIS — R6889 Other general symptoms and signs: Secondary | ICD-10-CM

## 2018-09-02 DIAGNOSIS — J101 Influenza due to other identified influenza virus with other respiratory manifestations: Secondary | ICD-10-CM

## 2018-09-02 LAB — POCT INFLUENZA A/B
Influenza A, POC: NEGATIVE
Influenza B, POC: POSITIVE — AB

## 2018-09-02 MED ORDER — FLUTICASONE PROPIONATE 50 MCG/ACT NA SUSP: 2.0000 | Freq: Every day | NASAL | 0 refills | Status: DC

## 2018-09-02 MED ORDER — OSELTAMIVIR PHOSPHATE 75 MG PO CAPS: 75.0000 mg | ORAL_CAPSULE | Freq: Two times a day (BID) | ORAL | 0 refills | Status: AC

## 2018-09-02 MED ORDER — PSEUDOEPH-BROMPHEN-DM 30-2-10 MG/5ML PO SYRP: 5.0000 mL | ORAL_SOLUTION | Freq: Four times a day (QID) | ORAL | 0 refills | Status: AC | PRN

## 2018-09-02 NOTE — Progress Notes (Signed)
Subjective:     Angela Perkins is a 33 y.o. female who presents for evaluation of influenza like symptoms. Symptoms include fevers up to 100 degrees, chills, headache, myalgias, clear nasal discharge, productive cough, sinus and nasal congestion and sore throat and have been present for 48  hours. She has tried to alleviate the symptoms with Alka Seltzer, Mucinex and Ibuprofen with minimal relief. High risk factors for influenza complications: none.  Patient informed she has not had an influenza vaccine this season.  The following portions of the patient's history were reviewed and updated as appropriate: allergies, current medications and past medical history.  Review of Systems Constitutional: positive for anorexia, chills, fatigue, fevers, malaise and sweats, negative for weight loss Eyes: negative Ears, nose, mouth, throat, and face: positive for nasal congestion, sore throat and sinus pressure, negative for ear drainage, earaches and hoarseness Respiratory: positive for cough, dyspnea on exertion and sputum, negative for asthma, chronic bronchitis, pneumonia, stridor and wheezing Cardiovascular: negative Gastrointestinal: positive for diarrhea, nausea and vomiting, negative for abdominal pain and constipation Neurological: positive for headaches, negative for coordination problems, dizziness, paresthesia, vertigo and weakness     Objective:    BP 112/78   Pulse (!) 105   Temp (!) 100.7 F (38.2 C)   Resp 12   Wt 196 lb 12.8 oz (89.3 kg)   SpO2 99%   BMI 34.86 kg/m   Physical Exam Constitutional:      Appearance: Normal appearance.     Comments: Appears uncomfortable  HENT:     Head: Normocephalic.     Right Ear: Tympanic membrane and ear canal normal.     Left Ear: Tympanic membrane and ear canal normal.     Nose: Congestion present.     Comments: + maxillary and frontal sinus pressure bilaterally    Mouth/Throat:     Pharynx: Posterior oropharyngeal erythema present.  No oropharyngeal exudate.     Comments: + posterior oropharyngeal erythema, tonsils +1 bilaterally, no exudate Eyes:     Pupils: Pupils are equal, round, and reactive to light.  Neck:     Musculoskeletal: Normal range of motion and neck supple. No neck rigidity or muscular tenderness.  Cardiovascular:     Rate and Rhythm: Regular rhythm. Tachycardia present.     Pulses: Normal pulses.     Heart sounds: Normal heart sounds.  Pulmonary:     Effort: Pulmonary effort is normal. No respiratory distress.     Breath sounds: Normal breath sounds. No wheezing or rales.  Abdominal:     General: There is no distension.     Tenderness: There is no abdominal tenderness.  Lymphadenopathy:     Cervical: Cervical adenopathy (superficial cervical adenopathy bilaterally) present.  Skin:    General: Skin is warm.  Neurological:     General: No focal deficit present.     Mental Status: She is alert and oriented to person, place, and time.  Psychiatric:        Mood and Affect: Mood normal.        Thought Content: Thought content normal.      Assessment:   Influenza B  Plan:   Exam findings, diagnosis etiology and medication use and indications reviewed with patient. Follow- Up and discharge instructions provided. No emergent/urgent issues found on exam.  Based on the patient's clinical presentation, symptoms, sudden onset of symptoms, and positive influenza test, patient's symptoms are congruent with influenza B.  Patient's parents did want to do Tamiflu.  We  will send a prescription for Tamiflu and will perform symptomatic treatment with Bromfed and Flonase to help with her nasal congestion.  Instructed patient that she must remain home until fever free for at least 24 hours.  I would also like the patient to return to our office to have an influenza vaccine.  Also discussed complications of influenza such as pneumonia with the patient.  Informed patient that patient most likely will have no issues  once a virus has run its course, but I wanted to make her aware of signs and symptoms if they were to begin.  Patient education was provided. Patient verbalized understanding of information provided and agrees with plan of care (POC), all questions answered. The patient is advised to call or return to clinic if condition does not see an improvement in symptoms, or to seek the care of the closest emergency department if condition worsens with the above plan  1. Flu-like symptoms  - POCT Influenza A/B  2. Influenza B  - oseltamivir (TAMIFLU) 75 MG capsule; Take 1 capsule (75 mg total) by mouth 2 (two) times daily for 5 days.  Dispense: 10 capsule; Refill: 0 - fluticasone (FLONASE) 50 MCG/ACT nasal spray; Place 2 sprays into both nostrils daily for 10 days.  Dispense: 16 g; Refill: 0 - brompheniramine-pseudoephedrine-DM 30-2-10 MG/5ML syrup; Take 5 mLs by mouth 4 (four) times daily as needed for up to 7 days.  Dispense: 150 mL; Refill: 0 -Take medication as prescribed. -Ibuprofen or Tylenol for pain, fever, or general discomfort. -Increase fluids. -Get plenty of rest. -Remain home until fever-free for 24 hours at least. -Sleep elevated on at least 2 pillows at bedtime to help with cough. -Use a humidifier or vaporizer when at home and during sleep to help with cough and nasal congestion. -Purchase OTC Delsym cough medicine.  Use as directed. -May use a teaspoon of honey or over-the-counter cough drops to help with cough. -Return to our office for an influenza vaccine. -Follow-up if symptoms do not improve.

## 2018-09-02 NOTE — Patient Instructions (Addendum)
Influenza, Adult  -Take medication as prescribed. -Ibuprofen or Tylenol for pain, fever, or general discomfort. -Increase fluids. -Get plenty of rest. -Remain home until fever-free for 24 hours at least. -Sleep elevated on at least 2 pillows at bedtime to help with cough. -Use a humidifier or vaporizer when at home and during sleep to help with cough and nasal congestion. -Purchase OTC Delsym cough medicine.  Use as directed. -May use a teaspoon of honey or over-the-counter cough drops to help with cough. -Return to our office for an influenza vaccine. -Follow-up if symptoms do not improve.  Influenza, more commonly known as "the flu," is a viral infection that mainly affects the respiratory tract. The respiratory tract includes organs that help you breathe, such as the lungs, nose, and throat. The flu causes many symptoms similar to the common cold along with high fever and body aches. The flu spreads easily from person to person (is contagious). Getting a flu shot (influenza vaccination) every year is the best way to prevent the flu. What are the causes? This condition is caused by the influenza virus. You can get the virus by:  Breathing in droplets that are in the air from an infected person's cough or sneeze.  Touching something that has been exposed to the virus (has been contaminated) and then touching your mouth, nose, or eyes. What increases the risk? The following factors may make you more likely to get the flu:  Not washing or sanitizing your hands often.  Having close contact with many people during cold and flu season.  Touching your mouth, eyes, or nose without first washing or sanitizing your hands.  Not getting a yearly (annual) flu shot. You may have a higher risk for the flu, including serious problems such as a lung infection (pneumonia), if you:  Are older than 65.  Are pregnant.  Have a weakened disease-fighting system (immune system). You may have a  weakened immune system if you: ? Have HIV or AIDS. ? Are undergoing chemotherapy. ? Are taking medicines that reduce (suppress) the activity of your immune system.  Have a long-term (chronic) illness, such as heart disease, kidney disease, diabetes, or lung disease.  Have a liver disorder.  Are severely overweight (morbidly obese).  Have anemia. This is a condition that affects your red blood cells.  Have asthma. What are the signs or symptoms? Symptoms of this condition usually begin suddenly and last 4-14 days. They may include:  Fever and chills.  Headaches, body aches, or muscle aches.  Sore throat.  Cough.  Runny or stuffy (congested) nose.  Chest discomfort.  Poor appetite.  Weakness or fatigue.  Dizziness.  Nausea or vomiting. How is this diagnosed? This condition may be diagnosed based on:  Your symptoms and medical history.  A physical exam.  Swabbing your nose or throat and testing the fluid for the influenza virus. How is this treated? If the flu is diagnosed early, you can be treated with medicine that can help reduce how severe the illness is and how long it lasts (antiviral medicine). This may be given by mouth (orally) or through an IV. Taking care of yourself at home can help relieve symptoms. Your health care provider may recommend:  Taking over-the-counter medicines.  Drinking plenty of fluids. In many cases, the flu goes away on its own. If you have severe symptoms or complications, you may be treated in a hospital. Follow these instructions at home: Activity  Rest as needed and get plenty of sleep.  Stay home from work or school as told by your health care provider. Unless you are visiting your health care provider, avoid leaving home until your fever has been gone for 24 hours without taking medicine. Eating and drinking  Take an oral rehydration solution (ORS). This is a drink that is sold at pharmacies and retail stores.  Drink  enough fluid to keep your urine pale yellow.  Drink clear fluids in small amounts as you are able. Clear fluids include water, ice chips, diluted fruit juice, and low-calorie sports drinks.  Eat bland, easy-to-digest foods in small amounts as you are able. These foods include bananas, applesauce, rice, lean meats, toast, and crackers.  Avoid drinking fluids that contain a lot of sugar or caffeine, such as energy drinks, regular sports drinks, and soda.  Avoid alcohol.  Avoid spicy or fatty foods. General instructions      Take over-the-counter and prescription medicines only as told by your health care provider.  Use a cool mist humidifier to add humidity to the air in your home. This can make it easier to breathe.  Cover your mouth and nose when you cough or sneeze.  Wash your hands with soap and water often, especially after you cough or sneeze. If soap and water are not available, use alcohol-based hand sanitizer.  Keep all follow-up visits as told by your health care provider. This is important. How is this prevented?   Get an annual flu shot. You may get the flu shot in late summer, fall, or winter. Ask your health care provider when you should get your flu shot.  Avoid contact with people who are sick during cold and flu season. This is generally fall and winter. Contact a health care provider if:  You develop new symptoms.  You have: ? Chest pain. ? Diarrhea. ? A fever.  Your cough gets worse.  You produce more mucus.  You feel nauseous or you vomit. Get help right away if:  You develop shortness of breath or difficulty breathing.  Your skin or nails turn a bluish color.  You have severe pain or stiffness in your neck.  You develop a sudden headache or sudden pain in your face or ear.  You cannot eat or drink without vomiting. Summary  Influenza, more commonly known as "the flu," is a viral infection that primarily affects your respiratory  tract.  Symptoms of the flu usually begin suddenly and last 4-14 days.  Getting an annual flu shot is the best way to prevent getting the flu.  Stay home from work or school as told by your health care provider. Unless you are visiting your health care provider, avoid leaving home until your fever has been gone for 24 hours without taking medicine.  Keep all follow-up visits as told by your health care provider. This is important. This information is not intended to replace advice given to you by your health care provider. Make sure you discuss any questions you have with your health care provider. Document Released: 08/01/2000 Document Revised: 01/20/2018 Document Reviewed: 01/20/2018 Elsevier Interactive Patient Education  2019 [employer].

## 2021-05-30 ENCOUNTER — Ambulatory Visit (HOSPITAL_COMMUNITY)
Admission: EM | Admit: 2021-05-30 | Discharge: 2021-05-30 | Disposition: A | Discharge status: Home / Self Care | Payer: 59 | Attending: Emergency Medicine | Admitting: Emergency Medicine

## 2021-05-30 ENCOUNTER — Encounter (HOSPITAL_COMMUNITY): Payer: Self-pay | Admitting: Emergency Medicine

## 2021-05-30 ENCOUNTER — Other Ambulatory Visit: Payer: Self-pay

## 2021-05-30 DIAGNOSIS — K0889 Other specified disorders of teeth and supporting structures: Secondary | ICD-10-CM | POA: Diagnosis not present

## 2021-05-30 DIAGNOSIS — J0100 Acute maxillary sinusitis, unspecified: Secondary | ICD-10-CM

## 2021-05-30 MED ORDER — LIDOCAINE VISCOUS HCL 2 % MT SOLN: 15.0000 mL | OROMUCOSAL | 0 refills | Status: DC | PRN

## 2021-05-30 MED ORDER — GUAIFENESIN ER 600 MG PO TB12: 600.0000 mg | ORAL_TABLET | Freq: Two times a day (BID) | ORAL | 0 refills | Status: DC

## 2021-05-30 MED ORDER — FLUTICASONE PROPIONATE 50 MCG/ACT NA SUSP: 1.0000 | Freq: Every day | NASAL | 2 refills | Status: DC

## 2021-05-30 NOTE — Discharge Instructions (Addendum)
Your symptoms are either related to your tooth which appears to possibly have a cavity and increase in sinus pressure related to congestion therefore we will attempt to manage both in efforts to get some relief  You may use 800 mg of ibuprofen every 8 hours as needed to help with pain  You may use lidocaine mouth solution every 4 hours in addition  You may begin use of nasal spray every morning to help reduce congestion  You may begin use of Mucinex twice a day as needed  You may follow-up at urgent care at any point for further evaluation if you feel like symptoms are worsening

## 2021-05-30 NOTE — ED Triage Notes (Signed)
Noticed pain and pressure upper mouth.  Removed a partial and had continued pressure.  Took ibuprofen, helped, but did not last for long. touches left side of face near mouth-describing pressure and pain when touching these areas

## 2021-05-31 NOTE — ED Provider Notes (Signed)
[facility]    CSN: [id number] Arrival date & time: 05/30/21  1042      History   Chief Complaint Chief Complaint  Patient presents with   Dental Pain    HPI Angela Perkins is a 35 y.o. female.   Patient presents with possible dental pain, endorses pressure around mouth on the left side.  Painful when touched and with chewing.  Wears partial dental plate, removed, no relief felt.  Endorses that she needs dental work, has no Radiographer, therapeutic.  Patient endorses nasal congestion, rhinorrhea and intermittent generalized headaches.  Has attempted use of over-the-counter medications with no relief.  Denies fever, chills, body aches, ear pain, facial swelling, difficulty swallowing, mouth lesions or sores.  No pertinent medical history.  Past Medical History:  Diagnosis Date   Depression    Headache(784.0)    Obesity    Trichomonas     Patient Active Problem List   Diagnosis Date Noted   DEPRESSION 10/11/2010   Myalgia and myositis, unspecified 10/11/2010   FATIGUE, ACUTE 10/11/2010   HEADACHE 10/11/2010    Past Surgical History:  Procedure Laterality Date   TONSILLECTOMY     TONSILLECTOMY      OB History     Gravida  1   Para  1   Term  1   Preterm      AB      Living  1      SAB      IAB      Ectopic      Multiple      Live Births               Home Medications    Prior to Admission medications   Medication Sig Start Date End Date Taking? Authorizing Provider  fluticasone (FLONASE) 50 MCG/ACT nasal spray Place 1 spray into both nostrils daily. 05/30/21  Yes Armonie Mettler R, NP  guaiFENesin (MUCINEX) 600 MG 12 hr tablet Take 1 tablet (600 mg total) by mouth 2 (two) times daily. 05/30/21  Yes Jalexus Brett R, NP  lidocaine (XYLOCAINE) 2 % solution Use as directed 15 mLs in the mouth or throat every 4 (four) hours as needed for mouth pain. 05/30/21  Yes Tracen Mahler R, NP  amoxicillin (AMOXIL) 500 MG capsule Take 1  capsule (500 mg total) by mouth 3 (three) times daily. Patient not taking: Reported on 05/30/2021 01/26/16   Pisciotta, Joni Reining, PA-C  HYDROcodone-acetaminophen (NORCO/VICODIN) 5-325 MG tablet Take 1-2 tablets by mouth every 6 hours as needed for pain and/or cough. Patient not taking: Reported on 05/30/2021 01/26/16   Pisciotta, Joni Reining, PA-C  lamoTRIgine (LAMICTAL) 150 MG tablet Take 150 mg by mouth every 12 (twelve) hours.   Patient not taking: Reported on 05/30/2021    [provider]  Pediatric Multivit-Minerals-C (KIDS GUMMY BEAR VITAMINS) CHEW Chew 2 each by mouth daily.   Patient not taking: Reported on 05/30/2021    [provider]    Family History Family History  Problem Relation Age of Onset   Cancer Mother        Ovarian   Fibromyalgia Sister    Cancer - Ovarian Sister    Cancer - Cervical Maternal Aunt    Arthritis Other    Depression Other     Social History Social History   Tobacco Use   Smoking status: Never   Smokeless tobacco: Never   Tobacco comments:    Regular Exercise - No  Vaping Use  Vaping Use: Never used  Substance Use Topics   Alcohol use: No   Drug use: No     Allergies   Patient has no known allergies.   Review of Systems Review of Systems Defer to HPI   Physical Exam Triage Vital Signs ED Triage Vitals  Enc Vitals Group     BP 05/30/21 1233 137/86     Pulse Rate 05/30/21 1233 60     Resp 05/30/21 1233 18     Temp 05/30/21 1233 99.3 F (37.4 C)     Temp Source 05/30/21 1233 Oral     SpO2 05/30/21 1233 100 %     Weight --      Height --      Head Circumference --      Peak Flow --      Pain Score 05/30/21 1228 8     Pain Loc --      Pain Edu? --      Excl. in GC? --    No data found.  Updated Vital Signs BP 137/86 (BP Location: Left Arm)   Pulse 60   Temp 99.3 F (37.4 C) (Oral)   Resp 18   LMP 05/16/2021   SpO2 100%   Visual Acuity Right Eye Distance:   Left Eye Distance:   Bilateral  Distance:    Right Eye Near:   Left Eye Near:    Bilateral Near:     Physical Exam Constitutional:      Appearance: Normal appearance. She is normal weight.  HENT:     Head: Normocephalic.     Right Ear: Tympanic membrane, ear canal and external ear normal.     Left Ear: Tympanic membrane, ear canal and external ear normal.     Nose: Congestion present. No rhinorrhea.     Right Sinus: Maxillary sinus tenderness present. No frontal sinus tenderness.     Left Sinus: Maxillary sinus tenderness present. No frontal sinus tenderness.     Mouth/Throat:     Pharynx: Oropharynx is clear.     Comments: Poor dentition, mild to moderate gingival swelling around the left upper incisor, tooth decay present at the left upper incisor, no dental abscess present Neurological:     Mental Status: She is alert.     UC Treatments / Results  Labs (all labs ordered are listed, but only abnormal results are displayed) Labs Reviewed - No data to display  EKG   Radiology No results found.  Procedures Procedures (including critical care time)  Medications Ordered in UC Medications - No data to display  Initial Impression / Assessment and Plan / UC Course  I have reviewed the triage vital signs and the nursing notes.  Pertinent labs & imaging results that were available during my care of the patient were reviewed by me and considered in my medical decision making (see chart for details).  Dental pain Acute nonrecurrent maxillary sinusitis  It is possible that etiology of symptoms are related to tooth decay however patient also has sinus pressure at the location of pain therefore we will move forward conservatively treating for both, patient given written form of local dentist for follow-up, patient to follow-up at urgent care as needed   1.  Over-the-counter ibuprofen recommended for pain management, patient already has medication at home 2.  Lidocaine viscous 2% 15 mL every 4 hours as  needed 3.  Flonase 50 mcg 1 spray each nare daily 4.  Guaifenesin 600 mg twice daily as needed  Final Clinical Impressions(s) / UC Diagnoses   Final diagnoses:  Pain, dental  Acute non-recurrent maxillary sinusitis     Discharge Instructions      Your symptoms are either related to your tooth which appears to possibly have a cavity and increase in sinus pressure related to congestion therefore we will attempt to manage both in efforts to get some relief  You may use 800 mg of ibuprofen every 8 hours as needed to help with pain  You may use lidocaine mouth solution every 4 hours in addition  You may begin use of nasal spray every morning to help reduce congestion  You may begin use of Mucinex twice a day as needed  You may follow-up at urgent care at any point for further evaluation if you feel like symptoms are worsening   ED Prescriptions     Medication Sig Dispense Auth. Provider   lidocaine (XYLOCAINE) 2 % solution Use as directed 15 mLs in the mouth or throat every 4 (four) hours as needed for mouth pain. 100 mL Tayquan Gassman R, NP   fluticasone (FLONASE) 50 MCG/ACT nasal spray Place 1 spray into both nostrils daily. 11.1 mL Jane Birkel R, NP   guaiFENesin (MUCINEX) 600 MG 12 hr tablet Take 1 tablet (600 mg total) by mouth 2 (two) times daily. 30 tablet Valinda Hoar, NP      PDMP not reviewed this encounter.   Valinda Hoar, NP 05/31/21 1032

## 2022-03-18 ENCOUNTER — Ambulatory Visit: Payer: Self-pay | Admitting: Nurse Practitioner

## 2022-03-18 VITALS — BP 120/80 | HR 90

## 2022-03-18 DIAGNOSIS — G8929 Other chronic pain: Secondary | ICD-10-CM

## 2022-03-18 MED ORDER — NAPROXEN 500 MG PO TABS: 500.0000 mg | ORAL_TABLET | Freq: Two times a day (BID) | ORAL | 0 refills | Status: AC
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
## 2022-03-27 ENCOUNTER — Encounter: Payer: Self-pay | Admitting: Nurse Practitioner

## 2022-03-27 ENCOUNTER — Ambulatory Visit: Payer: Self-pay | Admitting: Nurse Practitioner

## 2022-03-27 VITALS — BP 130/80 | HR 80 | Ht 63.0 in | Wt 210.0 lb

## 2022-03-27 DIAGNOSIS — Z789 Other specified health status: Secondary | ICD-10-CM

## 2022-03-27 NOTE — Progress Notes (Addendum)
Office Visit.   Subjective:  Patient ID: Angela Perkins, female    DOB: 02-26-1986  Age: 36 y.o. MRN: [id number]  CC: Wellness exam   HPI [name] presents for wellness exam visit for insurance benefit.  Patient does not have a PCP. PMH significant for: no significant PMH.  Last labs per PCP were completed:no labs completed in several years  Health Maintenance:  PAP smear- overdue for this.  Mammogram: start at age 28.   Smoker:never   Immunizations:  COVID- x3 Does not get yearly flu vaccine.   Lifestyle: Diet- No specific diet at home. Eats what she can afford.  Exercise- No exercise.     Past Medical History:  Diagnosis Date   Depression    Headache(784.0)    Obesity    Trichomonas     Past Surgical History:  Procedure Laterality Date   TONSILLECTOMY     TONSILLECTOMY      Outpatient Medications Prior to Visit  Medication Sig Dispense Refill   fluticasone (FLONASE) 50 MCG/ACT nasal spray Place 1 spray into both nostrils daily. 11.1 mL 2   guaiFENesin (MUCINEX) 600 MG 12 hr tablet Take 1 tablet (600 mg total) by mouth 2 (two) times daily. 30 tablet 0   lamoTRIgine (LAMICTAL) 150 MG tablet Take 150 mg by mouth every 12 (twelve) hours.   (Patient not taking: Reported on 05/30/2021)     lidocaine (XYLOCAINE) 2 % solution Use as directed 15 mLs in the mouth or throat every 4 (four) hours as needed for mouth pain. 100 mL 0   Pediatric Multivit-Minerals-C (KIDS GUMMY BEAR VITAMINS) CHEW Chew 2 each by mouth daily.   (Patient not taking: Reported on 05/30/2021)     No facility-administered medications prior to visit.    ROS Review of Systems  Constitutional:  Positive for appetite change and fatigue. Negative for unexpected weight change. Diaphoresis: not new, has dealt with this over the years.Marland Kitchen Respiratory:  Negative for shortness of breath.   Cardiovascular:  Negative for chest pain and leg swelling.  Musculoskeletal:  Positive for arthralgias  (shoulder pain- improved.).    Objective:  BP 130/80   Pulse 80   Ht 5\' 3"  (1.6 m)   Wt 210 lb (95.3 kg)   SpO2 99%   BMI 37.20 kg/m   Physical Exam Constitutional:      General: She is not in acute distress. HENT:     Head: Normocephalic.  Cardiovascular:     Rate and Rhythm: Normal rate and regular rhythm.     Heart sounds: Normal heart sounds.  Pulmonary:     Effort: Pulmonary effort is normal.  Musculoskeletal:        General: Normal range of motion.  Skin:    General: Skin is warm.  Neurological:     General: No focal deficit present.     Mental Status: She is alert and oriented to person, place, and time.  Psychiatric:        Mood and Affect: Mood normal.        Behavior: Behavior normal.      Assessment & Plan:    Loza was seen today for wellness exam.  Diagnoses and all orders for this visit:  Participant in health and wellness plan -     Comprehensive metabolic panel -     TSH -     Lipid Panel -     CBC with Differential -     Hemoglobin A1c   Adult  wellness physical was conducted today. Importance of diet and exercise were discussed in detail.  We reviewed immunizations and gave recommendations regarding current immunization needed for age.  In addition to this additional areas were also touched on including: will update labs today.  Preventative health exams needed: PAP Smear- encouraged to establish care with PCP.   Patient was advised yearly wellness exam  Orders Placed This Encounter  Procedures   Comprehensive metabolic panel   TSH   Lipid Panel   CBC with Differential   Hemoglobin A1c    No orders of the defined types were placed in this encounter.   Follow-up: Return if symptoms worsen or fail to improve.

## 2022-03-28 LAB — COMPREHENSIVE METABOLIC PANEL
AG Ratio: 1.2 (calc) (ref 1.0–2.5)
ALT: 11 U/L (ref 6–29)
AST: 10 U/L (ref 10–30)
Albumin: 4.1 g/dL (ref 3.6–5.1)
Alkaline phosphatase (APISO): 53 U/L (ref 31–125)
BUN: 8 mg/dL (ref 7–25)
CO2: 25 mmol/L (ref 20–32)
Calcium: 9.4 mg/dL (ref 8.6–10.2)
Chloride: 104 mmol/L (ref 98–110)
Creat: 0.87 mg/dL (ref 0.50–0.97)
Globulin: 3.4 g/dL (calc) (ref 1.9–3.7)
Glucose, Bld: 85 mg/dL (ref 65–99)
Potassium: 4.2 mmol/L (ref 3.5–5.3)
Sodium: 136 mmol/L (ref 135–146)
Total Bilirubin: 0.4 mg/dL (ref 0.2–1.2)
Total Protein: 7.5 g/dL (ref 6.1–8.1)

## 2022-03-28 LAB — LIPID PANEL
Cholesterol: 209 mg/dL — ABNORMAL HIGH (ref <200)
HDL: 66 mg/dL (ref >=50)
LDL Cholesterol (Calc): 120 mg/dL (calc) — ABNORMAL HIGH
Non-HDL Cholesterol (Calc): 143 mg/dL (calc) — ABNORMAL HIGH (ref <130)
Total CHOL/HDL Ratio: 3.2 (calc) (ref <5.0)
Triglycerides: 115 mg/dL (ref <150)

## 2022-03-28 LAB — CBC WITH DIFFERENTIAL/PLATELET
Absolute Monocytes: 352 cells/uL (ref 200–950)
Basophils Absolute: 21 cells/uL (ref 0–200)
Basophils Relative: 0.3 %
Eosinophils Absolute: 48 cells/uL (ref 15–500)
Eosinophils Relative: 0.7 %
HCT: 38.1 % (ref 35.0–45.0)
Hemoglobin: 12.5 g/dL (ref 11.7–15.5)
Lymphs Abs: 1987 cells/uL (ref 850–3900)
MCH: 28.9 pg (ref 27.0–33.0)
MCHC: 32.8 g/dL (ref 32.0–36.0)
MCV: 88 fL (ref 80.0–100.0)
MPV: 9.4 fL (ref 7.5–12.5)
Monocytes Relative: 5.1 %
Neutro Abs: 4492 cells/uL (ref 1500–7800)
Neutrophils Relative %: 65.1 %
Platelets: 387 10*3/uL (ref 140–400)
RBC: 4.33 10*6/uL (ref 3.80–5.10)
RDW: 12.3 % (ref 11.0–15.0)
Total Lymphocyte: 28.8 %
WBC: 6.9 10*3/uL (ref 3.8–10.8)

## 2022-03-28 LAB — HEMOGLOBIN A1C
Hgb A1c MFr Bld: 4.8 % of total Hgb (ref <5.7)
Mean Plasma Glucose: 91 mg/dL
eAG (mmol/L): 5 mmol/L

## 2022-03-28 LAB — TSH: TSH: 0.65 mIU/L

## 2022-05-29 ENCOUNTER — Ambulatory Visit: Payer: PRIVATE HEALTH INSURANCE | Admitting: Family Medicine

## 2022-05-29 ENCOUNTER — Encounter: Payer: Self-pay | Admitting: Family Medicine

## 2022-05-29 VITALS — BP 132/76 | HR 75 | Temp 97.6°F | Ht 63.0 in | Wt 217.0 lb

## 2022-05-29 DIAGNOSIS — G8929 Other chronic pain: Secondary | ICD-10-CM

## 2022-05-29 DIAGNOSIS — M25572 Pain in left ankle and joints of left foot: Secondary | ICD-10-CM

## 2022-05-29 DIAGNOSIS — E669 Obesity, unspecified: Secondary | ICD-10-CM | POA: Diagnosis not present

## 2022-05-29 DIAGNOSIS — M79672 Pain in left foot: Secondary | ICD-10-CM | POA: Diagnosis not present

## 2022-05-29 DIAGNOSIS — L659 Nonscarring hair loss, unspecified: Secondary | ICD-10-CM | POA: Diagnosis not present

## 2022-05-29 NOTE — Progress Notes (Unsigned)
New Patient Office Visit  Subjective    Patient ID: Angela Perkins, female    DOB: May 10, 1986  Age: 36 y.o. MRN: [id number]  CC:  Chief Complaint  Patient presents with   Establish Care    Would like to discuss weight loss options.   Discuss hair loss that has started within the last couple years.   Left ankle has been giving her issues, in the morning it throbs but thinks it is due to her job running around.     HPI PARA ASTI presents to establish care  Weight gain over the past 2 years.  Busy lifestyle.  States she is not eating a healthy diet.   C/o intermittent several months of left foot and ankle pain. Feels like a sprain  Hair thinning. Large area of baldness on the top of her scalp x 1 year.   Denies fever, chills, dizziness, chest pain, palpitations, shortness of breath, abdominal pain, N/V/D.   LMP: 3 week ago.  Not sexually active.   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    Outpatient Encounter Medications as of 05/29/2022  Medication Sig   Triamcinolone Acetonide (NASACORT ALLERGY 24HR NA) Place into the nose.   [DISCONTINUED] fluticasone (FLONASE) 50 MCG/ACT nasal spray Place 1 spray into both nostrils daily.   No facility-administered encounter medications on file as of 05/29/2022.    Past Medical History:  Diagnosis Date   Depression    Headache(784.0)    Obesity    Trichomonas     Past Surgical History:  Procedure Laterality Date   TONSILLECTOMY     TONSILLECTOMY      Family History  Problem Relation Age of Onset   Cancer Mother        Ovarian   Fibromyalgia Sister    Cancer - Ovarian Sister    Cancer - Cervical Maternal Aunt    Arthritis Other    Depression Other     Social History   Socioeconomic History   Marital status: Single    Spouse name: Not on file   Number of children: Not on file   Years of education: Not on file   Highest education level: Not on file  Occupational History   Not on file   Tobacco Use   Smoking status: Never   Smokeless tobacco: Never   Tobacco comments:    Regular Exercise - No  Vaping Use   Vaping Use: Never used  Substance and Sexual Activity   Alcohol use: No   Drug use: No   Sexual activity: Never  Other Topics Concern   Not on file  Social History Narrative   Not on file   Social Determinants of Health   Financial Resource Strain: Not on file  Food Insecurity: Not on file  Transportation Needs: Not on file  Physical Activity: Not on file  Stress: Not on file  Social Connections: Not on file  Intimate Partner Violence: Not on file    ROS      Objective    BP 132/76 (BP Location: Left Arm, Patient Position: Sitting, Cuff Size: Large)   Pulse 75   Temp 97.6 F (36.4 C) (Temporal)   Ht 5\' 3"  (1.6 m)   Wt 217 lb (98.4 kg)   SpO2 99%   BMI 38.44 kg/m   Physical Exam Constitutional:      General: She is not in acute distress.    Appearance: She is not ill-appearing.  HENT:  Head: Hair is abnormal.      Comments: Area of baldness, some new hair growth present. No erythema Cardiovascular:     Rate and Rhythm: Normal rate.  Pulmonary:     Effort: Pulmonary effort is normal.     Breath sounds: Normal breath sounds.  Skin:    General: Skin is warm and dry.  Neurological:     General: No focal deficit present.     Mental Status: She is alert and oriented to person, place, and time.  Psychiatric:        Mood and Affect: Mood normal.        Behavior: Behavior normal.     {Labs (Optional):23779}    Assessment & Plan:   Problem List Items Addressed This Visit   None Visit Diagnoses     Left foot pain    -  Primary   Relevant Orders   Ambulatory referral to Podiatry   Chronic pain of left ankle       Relevant Orders   Ambulatory referral to Podiatry   Hair thinning       Obesity (BMI 30-39.9)           Return in about 2 weeks (around 06/12/2022).   Harland Dingwall, NP-C

## 2022-06-12 ENCOUNTER — Ambulatory Visit: Payer: PRIVATE HEALTH INSURANCE | Admitting: Podiatry

## 2022-06-12 ENCOUNTER — Ambulatory Visit: Payer: PRIVATE HEALTH INSURANCE | Admitting: Family Medicine

## 2022-06-12 ENCOUNTER — Encounter: Payer: Self-pay | Admitting: Family Medicine

## 2022-06-12 VITALS — BP 116/76 | HR 75 | Temp 97.6°F | Ht 63.0 in | Wt 217.0 lb

## 2022-06-12 DIAGNOSIS — M25572 Pain in left ankle and joints of left foot: Secondary | ICD-10-CM

## 2022-06-12 DIAGNOSIS — L659 Nonscarring hair loss, unspecified: Secondary | ICD-10-CM

## 2022-06-12 DIAGNOSIS — E669 Obesity, unspecified: Secondary | ICD-10-CM

## 2022-06-12 DIAGNOSIS — G8929 Other chronic pain: Secondary | ICD-10-CM

## 2022-06-12 DIAGNOSIS — F439 Reaction to severe stress, unspecified: Secondary | ICD-10-CM

## 2022-06-12 NOTE — Patient Instructions (Signed)
Dermatology offices  [facility]: [phone] Address: [address], [place], [place] [address] [place], [place] [address]  [facility]: Phone #: [phone] Address: [address], [place], [place] [address]  [facility]: Phone: [phone]  Address: [address], [place], [place] [address]  [facility] Address: [address], [place], [place] [address] Phone: [phone] 

## 2022-06-12 NOTE — Progress Notes (Signed)
Subjective:     Patient ID: Angela Perkins, female    DOB: Oct 31, 1985, 36 y.o.   MRN: [id number]  Chief Complaint  Patient presents with   Follow-up    2 week f/u    HPI Patient is in today for follow up visit.   Depression and work stress.   Would like to do something about balding/thinning area on her scalp. It has been there for over a year.    Health Maintenance Due  Topic Date Due   HIV Screening  Never done   Hepatitis C Screening  Never done   TETANUS/TDAP  Never done   PAP SMEAR-Modifier  08/27/2015   INFLUENZA VACCINE  Never done    Past Medical History:  Diagnosis Date   Depression    Headache(784.0)    Obesity    Trichomonas     Past Surgical History:  Procedure Laterality Date   TONSILLECTOMY     TONSILLECTOMY      Family History  Problem Relation Age of Onset   Cancer Mother        Ovarian   Fibromyalgia Sister    Cancer - Ovarian Sister    Cancer - Cervical Maternal Aunt    Arthritis Other    Depression Other     Social History   Socioeconomic History   Marital status: Single    Spouse name: Not on file   Number of children: Not on file   Years of education: Not on file   Highest education level: Not on file  Occupational History   Not on file  Tobacco Use   Smoking status: Never   Smokeless tobacco: Never   Tobacco comments:    Regular Exercise - No  Vaping Use   Vaping Use: Never used  Substance and Sexual Activity   Alcohol use: No   Drug use: No   Sexual activity: Never  Other Topics Concern   Not on file  Social History Narrative   Not on file   Social Determinants of Health   Financial Resource Strain: Not on file  Food Insecurity: Not on file  Transportation Needs: Not on file  Physical Activity: Not on file  Stress: Not on file  Social Connections: Not on file  Intimate Partner Violence: Not on file    Outpatient Medications Prior to Visit  Medication Sig Dispense Refill   Triamcinolone Acetonide  (NASACORT ALLERGY 24HR NA) Place into the nose.     No facility-administered medications prior to visit.    No Known Allergies  ROS     Objective:    Physical Exam  BP 116/76 (BP Location: Left Arm, Patient Position: Sitting, Cuff Size: Large)   Pulse 75   Temp 97.6 F (36.4 C) (Temporal)   Ht 5\' 3"  (1.6 m)   Wt 217 lb (98.4 kg)   SpO2 98%   BMI 38.44 kg/m  Wt Readings from Last 3 Encounters:  06/12/22 217 lb (98.4 kg)  05/29/22 217 lb (98.4 kg)  03/27/22 210 lb (95.3 kg)   Alert and in no distress. Respirations unlabored.      Assessment & Plan:   Problem List Items Addressed This Visit   None Visit Diagnoses     Obesity (BMI 30-39.9)    -  Primary   Stress       Chronic pain of left ankle       Hair thinning          She will call and  schedule with dermatology. A list provided.  Recommend making healthy food choices and staying active.  Reviewed recent lab results.  Follow up in 3 months or sooner if needed.   I am having Litzi L. Clinkscale maintain her Triamcinolone Acetonide (NASACORT ALLERGY 24HR NA).  No orders of the defined types were placed in this encounter.

## 2022-06-16 DIAGNOSIS — E669 Obesity, unspecified: Secondary | ICD-10-CM | POA: Insufficient documentation

## 2022-06-16 DIAGNOSIS — L659 Nonscarring hair loss, unspecified: Secondary | ICD-10-CM | POA: Insufficient documentation

## 2022-06-16 DIAGNOSIS — G8929 Other chronic pain: Secondary | ICD-10-CM | POA: Insufficient documentation

## 2022-06-16 DIAGNOSIS — F439 Reaction to severe stress, unspecified: Secondary | ICD-10-CM | POA: Insufficient documentation

## 2022-06-19 ENCOUNTER — Ambulatory Visit: Payer: PRIVATE HEALTH INSURANCE | Admitting: Podiatry

## 2022-06-19 ENCOUNTER — Ambulatory Visit (INDEPENDENT_AMBULATORY_CARE_PROVIDER_SITE_OTHER): Payer: PRIVATE HEALTH INSURANCE

## 2022-06-19 DIAGNOSIS — Q666 Other congenital valgus deformities of feet: Secondary | ICD-10-CM

## 2022-06-19 DIAGNOSIS — M722 Plantar fascial fibromatosis: Secondary | ICD-10-CM | POA: Diagnosis not present

## 2022-06-19 DIAGNOSIS — M79672 Pain in left foot: Secondary | ICD-10-CM | POA: Diagnosis not present

## 2022-06-19 DIAGNOSIS — M76829 Posterior tibial tendinitis, unspecified leg: Secondary | ICD-10-CM | POA: Diagnosis not present

## 2022-06-19 MED ORDER — METHYLPREDNISOLONE 4 MG PO TBPK: ORAL_TABLET | ORAL | 0 refills | Status: AC

## 2022-06-19 MED ORDER — MELOXICAM 15 MG PO TABS: 15.0000 mg | ORAL_TABLET | Freq: Every day | ORAL | 0 refills | Status: AC | PRN

## 2022-06-19 NOTE — Progress Notes (Signed)
Subjective:   Patient ID: Angela Perkins, female   DOB: 36 y.o.   MRN: [id number]   HPI Chief Complaint  Patient presents with   Foot Pain    Left foot arch, heel and ankle pain, started year ago after patient started working new job,  morning pain, Rate of pain 7 out of 10 TX: Stretching,  heat, and soaking     36 year old female presents for above concerns.  She states that she got a new job about 1 year ago when the pain started. She got some Dr. Zoe Lan inserts which helped some but not much. She iso on her feet all day and constanctly walking.While she is workin she has some pain but the next day it trobbs and hurts. She takies ibuprofen which hleps sometimes. She does get some ankle swelling. No numbness or tingling. She has trie dsome stretching exercises to help. She was getting sharp pain in the morning and that is the worst time of the day.    Review of Systems  All other systems reviewed and are negative.   Past Medical History:  Diagnosis Date   Depression    Headache(784.0)    Obesity    Trichomonas     Past Surgical History:  Procedure Laterality Date   TONSILLECTOMY     TONSILLECTOMY       Current Outpatient Medications:    Triamcinolone Acetonide (NASACORT ALLERGY 24HR NA), Place into the nose., Disp: , Rfl:   No Known Allergies        Objective:  Physical Exam  General: AAO x3, NAD  Dermatological: Skin is warm, dry and supple bilateral. There are no open sores, no preulcerative lesions, no rash or signs of infection present.  Vascular: Dorsalis Pedis artery and Posterior Tibial artery pedal pulses are 2/4 bilateral with immedate capillary fill time. There is no pain with calf compression, swelling, warmth, erythema.   Neruologic: Grossly intact via light touch bilateral.  Musculoskeletal: Tenderness palpation of the course of the posterior tibial tendon posterior and inferior to the medial malleolus towards the navicular tuberosity.  There is  localized edema there is no erythema.  Not appreciate any area pinpoint tenderness.  She gets some discomfort the plantar aspect calcaneus along insertion of plantar fascia.  Decreased medial arch upon weightbearing.  She is able to do a single and double heel rise although painful.  Gait: Unassisted, Nonantalgic.       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  3 views left foot and 2 views of the left ankle were obtained.  No subacute fracture.  Decreased calcaneal inclination angle.  No evidence of tarsal coalition. -Prescribed Medrol Dosepak.  Once complete can start meloxicam.  Recommended not to take together. -Trilock ankle brace dispensed for immobilization to help promote soft tissue healing -Stretching, icing a regular basis.  She can start rehab exercises as her pain starts to improve.  Discussed long-term shoes and good arch support.  Discussed different types of inserts.  She was measured for custom orthotics today.  Trula Slade DPM

## 2022-06-19 NOTE — Patient Instructions (Signed)
Start with the medrol dose pack (steroid) and once complete you can start the meloxicam (anti-inflammatory). Do not take together  Posterior Tibial Tendon Tear Rehab Ask your health care provider which exercises are safe for you. Do exercises exactly as told by your health care provider and adjust them as directed. It is normal to feel mild stretching, pulling, tightness, or discomfort as you do these exercises. Stop right away if you feel sudden pain or your pain gets worse. Do not begin these exercises until told by your health care provider. Stretching and range-of-motion exercises These exercises warm up your muscles and joints and improve the movement and flexibility of your ankle. These exercises also help to relieve pain, numbness, and tingling. Gastroc stretch  Sit on the floor with your left / right leg extended. Loop a belt or towel around ball of your left / right foot. The ball of your foot is on the walking surface, right under your toes. Keep your left / right ankle and foot relaxed and keep your knee straight while you use the belt or towel to pull your foot and ankle toward you. You should feel a gentle stretch behind your calf or knee (gastrocnemius). Hold this position for __________ seconds. Repeat __________ times. Complete this exercise __________ times a day. Active ankle dorsiflexion and plantar flexion  Sit with your left / right knee straight or bent. Do not rest your foot on anything. Flex your left / right ankle to tilt the top of your foot toward your shin (dorsiflexion). Hold this position for __________ seconds. Point your toes downward to tilt the top of your foot away from your shin (plantar flexion). Hold this position for __________ seconds. Repeat __________ times with your knee straight and __________ times with your knee bent. Complete this exercise __________ times a day. Passive ankle plantar flexion  Sit with your left / right leg crossed over your  opposite knee. With your left / right hand, pull the front of your foot and toes toward you (plantar flexion). You should feel a gentle stretch on the top of your foot and ankle. Hold this position for __________ seconds. Repeat __________ times. Complete this exercise __________ times a day. Passive ankle eversion  Sit with your left / right ankle crossed over your opposite knee. With your left / right hand, hold your foot so that your thumb is on the top of your foot and your fingers are on the bottom of your foot. Gently push and twist your ankle downward (eversion) so the smallest toes rise slightly toward the ceiling. You should feel a gentle stretch on the inside of your ankle. Hold this stretch for __________ seconds. Repeat __________ times. Complete this exercise __________ times a day. Passive ankle inversion  Sit with your left / right ankle crossed over your opposite knee. With your left / right hand, hold your foot so that your thumb is on the bottom of your foot and your fingers are across the top of your foot. Gently pull and twist your foot so the smallest toe comes toward you (inversion). You should feel a gentle stretch on the outside of your ankle. Hold the stretch for __________ seconds. Repeat __________ times. Complete this exercise __________ times a day. Ankle alphabet  Sit with your left / right leg supported at the lower leg. Do not rest your foot on anything. Make sure your foot has room to move freely. Think of your left / right foot as a paintbrush, and  move your foot to trace each letter of the alphabet in the air. Keep your hip and knee still while you trace. Trace every letter of the alphabet. Repeat __________ times. Complete this exercise __________ times a day. Strengthening exercises These exercises build strength and endurance in your lower leg. Endurance is the ability to use your muscles for a long time, even after they get  tired. Dorsiflexion  Secure a rubber exercise band or tube to an object that will not move if it is pulled on, such as a table leg. Secure the other end of the band around your left / right foot. Sit on the floor, facing the object with your left / right leg extended. The band or tube should be slightly tense when your foot is relaxed. Slowly flex your left / right ankle and toes to bring your foot toward you (dorsiflexion). Hold this position for __________ seconds. Let the band or tube slowly pull your foot back to the starting position. Repeat __________ times. Complete this exercise __________ times a day. Plantar flexion while seated  Sit on the floor with your left / right leg extended. Loop a rubber exercise band or tube around the ball of your left / right foot. The ball of your foot is on the walking surface, right under your toes. The band or tube should be slightly tense when your foot is relaxed. Slowly point your toes downward, pushing them away from you (plantar flexion). Hold this position for __________ seconds. Let the band or tube slowly pull your foot back to the starting position. Repeat __________ times. Complete this exercise __________ times a day. Towel curls  Sit in a chair on a non-carpeted surface, and put your feet on the floor. Place a towel in front of your feet. If told by your health care provider, add __________ to the end of the towel. Keeping your heel on the floor, put your left / right foot on the towel. Pull the towel toward you by grabbing the towel with your toes and curling them under. Keep your heel on the floor. Repeat __________ times. Complete this exercise __________ times a day. This information is not intended to replace advice given to you by your health care provider. Make sure you discuss any questions you have with your health care provider. Document Revised: 11/26/2018 Document Reviewed: 09/22/2018 Elsevier Patient Education  [place].

## 2022-06-23 ENCOUNTER — Telehealth: Payer: Self-pay | Admitting: Podiatry

## 2022-06-23 NOTE — Telephone Encounter (Signed)
-----   Message from Trula Slade, DPM sent at 06/22/2022  7:22 AM EST ----- I put in her AVS 34-month follow-up and I meant to say 6 to 8 weeks.  Can she be scheduled?  Thank you.

## 2022-06-23 NOTE — Telephone Encounter (Signed)
Lmom to call back to reschedule for 6 wks instead of 6 months

## 2022-07-07 ENCOUNTER — Telehealth: Payer: Self-pay | Admitting: Podiatry

## 2022-07-07 NOTE — Telephone Encounter (Signed)
-----   Message from Vivi Barrack, DPM sent at 06/22/2022  7:22 AM EST ----- I put in her AVS 80-month follow-up and I meant to say 6 to 8 weeks.  Can she be scheduled?  Thank you.

## 2022-07-07 NOTE — Telephone Encounter (Signed)
Left message on voicemail to call back to schedule appointment around 12/14 to 12/21? That would fall the 6 to 8 week time period.

## 2022-07-08 NOTE — Telephone Encounter (Signed)
She is scheduled for 12/21

## 2022-07-17 ENCOUNTER — Telehealth: Payer: Self-pay | Admitting: Podiatry

## 2022-07-17 NOTE — Telephone Encounter (Signed)
Lmom to call back to schedule appt to pick up orthotics  

## 2022-08-07 ENCOUNTER — Ambulatory Visit: Payer: PRIVATE HEALTH INSURANCE | Admitting: Podiatry

## 2022-08-26 NOTE — Telephone Encounter (Signed)
Lmom to call back to schedule appt to pick up orthotics  

## 2022-08-28 ENCOUNTER — Ambulatory Visit: Payer: PRIVATE HEALTH INSURANCE | Admitting: Podiatry

## 2022-09-12 ENCOUNTER — Ambulatory Visit: Payer: PRIVATE HEALTH INSURANCE | Admitting: Family Medicine

## 2022-10-02 ENCOUNTER — Ambulatory Visit: Payer: PRIVATE HEALTH INSURANCE | Admitting: Podiatry

## 2022-10-02 DIAGNOSIS — M76829 Posterior tibial tendinitis, unspecified leg: Secondary | ICD-10-CM | POA: Diagnosis not present

## 2022-10-02 DIAGNOSIS — R52 Pain, unspecified: Secondary | ICD-10-CM

## 2022-10-07 NOTE — Progress Notes (Signed)
Subjective: Chief Complaint  Patient presents with   Foot Problem    PICKING UP ORTHOTICS    37 year old female presents the office today to pick up orthotics.  She states the ankles are still causing discomfort at times.  No recent.  She is otherwise has a cellulitis.  Objective: AAO x3, NAD DP/PT pulses palpable bilaterally, CRT less than 3 seconds There is still tenderness palpation on the lateral portion of posterior tibial tendon.  Also mild discomfort towards navicular tuberosity.  There is slight edema but no erythema or warmth.  Decreased medial arch upon weightbearing.  No area pinpoint tenderness.  Ankle, subtalar review motion intact. No pain with calf compression, swelling, warmth, erythema  Assessment: PTTD  Plan: -All treatment options discussed with the patient including all alternatives, risks, complications.  -Orthotics were dispensed.  Both oral and written break-in instructions were provided to the patient.  Discussed that if she needs any modifications now we will be happy to accommodate this for her.  Continue with supportive shoes, home stretching, rehab exercises.  If symptoms persist consider MRI. -Patient encouraged to call the office with any questions, concerns, change in symptoms.   Trula Slade DPM

## 2022-10-27 ENCOUNTER — Encounter: Payer: Self-pay | Admitting: Podiatry

## 2022-11-17 ENCOUNTER — Ambulatory Visit (INDEPENDENT_AMBULATORY_CARE_PROVIDER_SITE_OTHER): Payer: PRIVATE HEALTH INSURANCE

## 2022-11-17 DIAGNOSIS — M76829 Posterior tibial tendinitis, unspecified leg: Secondary | ICD-10-CM

## 2022-11-17 DIAGNOSIS — Q666 Other congenital valgus deformities of feet: Secondary | ICD-10-CM

## 2022-11-17 NOTE — Progress Notes (Signed)
Patient presents to the office today with issues concerning their custom inserts. Patient states that the orthotics made her foot hurt worse. Upon looking at them, they appear to be too narrow on the heel and she states that the arch feels too high.   Will send orthotics to lab for adjustments.  Advised patient that she will receive a call from the office to come in for a fitting.

## 2022-12-17 ENCOUNTER — Other Ambulatory Visit: Payer: PRIVATE HEALTH INSURANCE

## 2022-12-18 ENCOUNTER — Ambulatory Visit: Payer: PRIVATE HEALTH INSURANCE | Admitting: Podiatry

## 2023-01-02 ENCOUNTER — Ambulatory Visit: Payer: PRIVATE HEALTH INSURANCE

## 2023-01-02 NOTE — Progress Notes (Incomplete)
Patient presents to the office today with issues concerning their custom inserts. Patient states that ***  Will send orthotics to lab for adjustments.  Advised patient that she will receive a call from the office to come in for a fitting.   

## 2023-04-09 ENCOUNTER — Ambulatory Visit: Payer: PRIVATE HEALTH INSURANCE

## 2023-04-09 NOTE — Progress Notes (Signed)
   Patient was seen, measured for custom molded foot orthotics this is a re-make from ones we lost she states she spoke with someone on phone who said we can ship to her as she is moving to [facility].  Patient will benefit from CFO's as they will help provide total contact to MLA's helping to better distribute body weight across BIL feet greater reducing plantar pressure and pain and to also encourage FF and RF alignment  Patient was scanned items to be ordered and fit when in   [employer], [employer], CFm

## 2023-04-21 ENCOUNTER — Telehealth: Payer: Self-pay | Admitting: Podiatry

## 2023-04-21 NOTE — Telephone Encounter (Signed)
[place] and would like her orthotics shipped to her home address

## 2023-04-21 NOTE — Telephone Encounter (Signed)
Orthotics in [place].. lvm for pt to call to schedule an appt to [facility].Marland Kitchen

## 2023-06-08 ENCOUNTER — Telehealth: Payer: Self-pay | Admitting: Podiatry

## 2023-06-08 NOTE — Telephone Encounter (Signed)
Pt called and her orthotics were to have been shipped to her in [place]. She apologized that it took her a while to call back.  I explained [facility] [place] was out but If I get a chance I will run down and get them and put them in the mail. I did tell her I would call when they are going out in the mail.

## 2023-06-15 NOTE — Telephone Encounter (Signed)
Shipped today tracking # [id number] [employer]
